# Patient Record
Sex: Female | Born: 1995 | Race: Black or African American | Hispanic: No | Marital: Single | Smoking: Never smoker
Health system: Southern US, Community
[De-identification: ages and names within clinical notes are randomized; demographics above are authoritative.]

## PROBLEM LIST (undated history)

## (undated) DIAGNOSIS — A749 Chlamydial infection, unspecified: Secondary | ICD-10-CM

## (undated) DIAGNOSIS — N83209 Unspecified ovarian cyst, unspecified side: Secondary | ICD-10-CM

## (undated) DIAGNOSIS — N730 Acute parametritis and pelvic cellulitis: Secondary | ICD-10-CM

---
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
## 2015-11-18 ENCOUNTER — Emergency Department (HOSPITAL_COMMUNITY)
Admission: EM | Admit: 2015-11-18 | Discharge: 2015-11-18 | Disposition: A | Discharge status: Home / Self Care | Payer: Self-pay | Attending: Emergency Medicine | Admitting: Emergency Medicine

## 2015-11-18 ENCOUNTER — Encounter (HOSPITAL_COMMUNITY): Payer: Self-pay | Admitting: *Deleted

## 2015-11-18 ENCOUNTER — Emergency Department (HOSPITAL_COMMUNITY): Payer: Self-pay

## 2015-11-18 DIAGNOSIS — R935 Abnormal findings on diagnostic imaging of other abdominal regions, including retroperitoneum: Secondary | ICD-10-CM

## 2015-11-18 DIAGNOSIS — R1032 Left lower quadrant pain: Secondary | ICD-10-CM

## 2015-11-18 DIAGNOSIS — N83202 Unspecified ovarian cyst, left side: Secondary | ICD-10-CM

## 2015-11-18 DIAGNOSIS — N72 Inflammatory disease of cervix uteri: Secondary | ICD-10-CM

## 2015-11-18 DIAGNOSIS — R102 Pelvic and perineal pain: Secondary | ICD-10-CM

## 2015-11-18 LAB — COMPREHENSIVE METABOLIC PANEL
ALBUMIN: 3.3 g/dL — AB (ref 3.5–5.0)
ALT: 12 U/L — ABNORMAL LOW (ref 14–54)
AST: 20 U/L (ref 15–41)
Alkaline Phosphatase: 66 U/L (ref 38–126)
Anion gap: 11 (ref 5–15)
BILIRUBIN TOTAL: 0.6 mg/dL (ref 0.3–1.2)
BUN: 9 mg/dL (ref 6–20)
CO2: 24 mmol/L (ref 22–32)
Calcium: 9.4 mg/dL (ref 8.9–10.3)
Chloride: 105 mmol/L (ref 101–111)
Creatinine, Ser: 0.78 mg/dL (ref 0.44–1.00)
GFR calc Af Amer: >60 mL/min (ref >60)
GFR calc non Af Amer: >60 mL/min (ref >60)
GLUCOSE: 89 mg/dL (ref 65–99)
POTASSIUM: 4.2 mmol/L (ref 3.5–5.1)
Sodium: 140 mmol/L (ref 135–145)
TOTAL PROTEIN: 7.2 g/dL (ref 6.5–8.1)

## 2015-11-18 LAB — URINALYSIS, ROUTINE W REFLEX MICROSCOPIC
BILIRUBIN URINE: NEGATIVE
GLUCOSE, UA: NEGATIVE mg/dL
HGB URINE DIPSTICK: NEGATIVE
KETONES UR: NEGATIVE mg/dL
NITRITE: NEGATIVE
PH: 6.5 (ref 5.0–8.0)
Protein, ur: NEGATIVE mg/dL
Specific Gravity, Urine: 1.026 (ref 1.005–1.030)

## 2015-11-18 LAB — CBC
HEMATOCRIT: 37.6 % (ref 36.0–46.0)
Hemoglobin: 12.3 g/dL (ref 12.0–15.0)
MCH: 28.6 pg (ref 26.0–34.0)
MCHC: 32.7 g/dL (ref 30.0–36.0)
MCV: 87.4 fL (ref 78.0–100.0)
Platelets: 282 10*3/uL (ref 150–400)
RBC: 4.3 MIL/uL (ref 3.87–5.11)
RDW: 12.4 % (ref 11.5–15.5)
WBC: 5.9 10*3/uL (ref 4.0–10.5)

## 2015-11-18 LAB — WET PREP, GENITAL
Clue Cells Wet Prep HPF POC: NONE SEEN
Sperm: NONE SEEN
Trich, Wet Prep: NONE SEEN
Yeast Wet Prep HPF POC: NONE SEEN

## 2015-11-18 LAB — I-STAT BETA HCG BLOOD, ED (MC, WL, AP ONLY): I-stat hCG, quantitative: <5 m[IU]/mL (ref <5)

## 2015-11-18 LAB — LIPASE, BLOOD: Lipase: 23 U/L (ref 11–51)

## 2015-11-18 LAB — URINE MICROSCOPIC-ADD ON: RBC / HPF: NONE SEEN RBC/hpf (ref 0–5)

## 2015-11-18 MED ORDER — DEXTROSE 5 % IV SOLN
1.0000 g | Freq: Once | INTRAVENOUS | Status: AC
  Administered 2015-11-18: 1 g via INTRAVENOUS
  Filled 2015-11-18: qty 10

## 2015-11-18 MED ORDER — IOHEXOL 300 MG/ML  SOLN: 100.0000 mL | Freq: Once | INTRAMUSCULAR | Status: DC | PRN

## 2015-11-18 MED ORDER — CEFTRIAXONE SODIUM 1 G IJ SOLR: 1.0000 g | Freq: Once | INTRAMUSCULAR | Status: DC

## 2015-11-18 MED ORDER — AZITHROMYCIN 250 MG PO TABS
1000.0000 mg | ORAL_TABLET | Freq: Once | ORAL | Status: AC
  Administered 2015-11-18: 1000 mg via ORAL
  Filled 2015-11-18: qty 4

## 2015-11-18 NOTE — Discharge Instructions (Signed)
Cervicitis Cervicitis is a soreness and puffiness (inflammation) of the cervix.  HOME CARE  Do not have sex (intercourse) until your doctor says it is okay.  Do not have sex until your partner is treated or as told by your doctor.  Take your antibiotic medicine as told. Finish it even if you start to feel better. GET HELP IF:   Your symptoms that brought you to the doctor come back.  You have a fever. MAKE SURE YOU:   Understand these instructions.  Will watch your condition.  Will get help right away if you are not doing well or get worse.   This information is not intended to replace advice given to you by your health care provider. Make sure you discuss any questions you have with your health care provider.  Follow up with OBGYN for re-evaluation. Take ibuprofen or tylenol as needed for pain. Your test cultures are still pending. You will be contacted with their results in approximately 72 hours. Return to the ED if you experience severe worsening of your symptoms, fevers, chills, vomiting, vaginal bleeding.

## 2015-11-18 NOTE — ED Provider Notes (Signed)
CSN: [id number]     Arrival date & time 11/18/15  0865 History   First MD Initiated Contact with Patient 11/18/15 1344     Chief Complaint  Patient presents with  . Abdominal Pain     (Consider location/radiation/quality/duration/timing/severity/associated sxs/prior Treatment) HPI   Tammie Lee is a 20 y.o F with no significant pmhx who presents to the ED c/o abdominal pain and increased vaginal discharge. Pt states that after her menstrual cycle ended 1 week ago pt began having RUQ pressure that worsened with coughing, sneezing and movement. Pressure was constant and non-radiating. Pt states that the pain has now moved into her RLQ 3 days ago and is described as crampy. Pt states that it "feels like an ovarian cyst". Pt also has associated green vaginal discharge that has been present for 1 week. Denies dysuria, dyspareunia, fevers, chills, N/V/D, vaginal bleeding, vaginal pruritis. Pt is sexually active with 1 partner. Endorses condom use. No additional contraception use.   History reviewed. No pertinent past medical history. History reviewed. No pertinent past surgical history. History reviewed. No pertinent family history. Social History  Substance Use Topics  . Smoking status: Never Smoker   . Smokeless tobacco: None  . Alcohol Use: No   OB History    No data available     Review of Systems  All other systems reviewed and are negative.     Allergies  Review of patient's allergies indicates no known allergies.  Home Medications   Prior to Admission medications   Not on File   BP 121/78 mmHg  Pulse 98  Temp(Src) 97.9 F (36.6 C) (Oral)  Resp 18  Wt 66.225 kg  SpO2 100%  LMP 11/04/2015 Physical Exam  Constitutional: She is oriented to person, place, and time. She appears well-developed and well-nourished. No distress.  HENT:  Head: Normocephalic and atraumatic.  Mouth/Throat: No oropharyngeal exudate.  Eyes: Conjunctivae and EOM are normal. Pupils are  equal, round, and reactive to light. Right eye exhibits no discharge. Left eye exhibits no discharge. No scleral icterus.  Cardiovascular: Normal rate, regular rhythm, normal heart sounds and intact distal pulses.  Exam reveals no gallop and no friction rub.   No murmur heard. Pulmonary/Chest: Effort normal and breath sounds normal. No respiratory distress. She has no wheezes. She has no rales. She exhibits no tenderness.  Abdominal: Soft. She exhibits no distension and no mass. There is tenderness. There is no rebound and no guarding.    Genitourinary: Cervix exhibits motion tenderness and discharge. Right adnexum displays tenderness. Vaginal discharge found.  Musculoskeletal: Normal range of motion. She exhibits no edema.  Neurological: She is alert and oriented to person, place, and time.  Skin: Skin is warm and dry. No rash noted. She is not diaphoretic. No erythema. No pallor.  Psychiatric: She has a normal mood and affect. Her behavior is normal.  Nursing note and vitals reviewed.   ED Course  Procedures (including critical care time) Labs Review Labs Reviewed  COMPREHENSIVE METABOLIC PANEL - Abnormal; Notable for the following:    Albumin 3.3 (*)    ALT 12 (*)    All other components within normal limits  URINALYSIS, ROUTINE W REFLEX MICROSCOPIC (NOT AT [facility]) - Abnormal; Notable for the following:    Color, Urine AMBER (*)    Leukocytes, UA SMALL (*)    All other components within normal limits  URINE MICROSCOPIC-ADD ON - Abnormal; Notable for the following:    Squamous Epithelial / LPF 6-30 (*)  Bacteria, UA RARE (*)    All other components within normal limits  WET PREP, GENITAL  URINE CULTURE  LIPASE, BLOOD  CBC  I-STAT BETA HCG BLOOD, ED ([facility], [facility], [facility] ONLY)  GC/CHLAMYDIA PROBE AMP ([facility]) NOT AT [facility]    Imaging Review US Transvaginal Non-ob  11/18/2015  CLINICAL DATA:  Left ovarian cystic lesion seen on CT EXAM: TRANSABDOMINAL AND TRANSVAGINAL ULTRASOUND OF  PELVIS TECHNIQUE: Both transabdominal and transvaginal ultrasound examinations of the pelvis were performed. Transabdominal technique was performed for global imaging of the pelvis including uterus, ovaries, adnexal regions, and pelvic cul-de-sac. It was necessary to proceed with endovaginal exam following the transabdominal exam to visualize the endometrium and ovaries. COMPARISON:  CT earlier today FINDINGS: Uterus Measurements: 7.0 x 3.4 x 4.4 cm. No fibroids or other mass visualized. Endometrium Thickness: 6 mm in thickness.  No focal abnormality visualized. Right ovary Measurements: 3.1 x 2.5 x 2.6 cm. Normal appearance/no adnexal mass. Left ovary Measurements: 4.4 x 3.4 x 4.1 cm. 3.7 cm complex cystic lesion with internal echoes/debris, most likely hemorrhagic cyst or endometrioma. Other findings Trace free fluid in the right adnexa. IMPRESSION: 3.7 cm complex cystic lesion within the left ovary with internal echoes and debris. This could reflect a hemorrhagic cyst or endometrioma. Electronically Signed   By: Charlett Nose M.D.   On: 11/18/2015 18:32   US Pelvis Complete  11/18/2015  CLINICAL DATA:  Left ovarian cystic lesion seen on CT EXAM: TRANSABDOMINAL AND TRANSVAGINAL ULTRASOUND OF PELVIS TECHNIQUE: Both transabdominal and transvaginal ultrasound examinations of the pelvis were performed. Transabdominal technique was performed for global imaging of the pelvis including uterus, ovaries, adnexal regions, and pelvic cul-de-sac. It was necessary to proceed with endovaginal exam following the transabdominal exam to visualize the endometrium and ovaries. COMPARISON:  CT earlier today FINDINGS: Uterus Measurements: 7.0 x 3.4 x 4.4 cm. No fibroids or other mass visualized. Endometrium Thickness: 6 mm in thickness.  No focal abnormality visualized. Right ovary Measurements: 3.1 x 2.5 x 2.6 cm. Normal appearance/no adnexal mass. Left ovary Measurements: 4.4 x 3.4 x 4.1 cm. 3.7 cm complex cystic lesion with  internal echoes/debris, most likely hemorrhagic cyst or endometrioma. Other findings Trace free fluid in the right adnexa. IMPRESSION: 3.7 cm complex cystic lesion within the left ovary with internal echoes and debris. This could reflect a hemorrhagic cyst or endometrioma. Electronically Signed   By: Charlett Nose M.D.   On: 11/18/2015 18:32   Ct Abdomen Pelvis W Contrast  11/18/2015  CLINICAL DATA:  RIGHT upper quadrant pain for 2 weeks. Pain radiates to the RIGHT lower quadrant. EXAM: CT ABDOMEN AND PELVIS WITH CONTRAST TECHNIQUE: Multidetector CT imaging of the abdomen and pelvis was performed using the standard protocol following bolus administration of intravenous contrast. CONTRAST:  100 cc Omnipaque COMPARISON:  None. FINDINGS: Lower chest: Lung bases are clear. Hepatobiliary: No focal hepatic lesion. No biliary duct dilatation. Gallbladder is normal. Common bile duct is normal. Pancreas: Pancreas is normal. No ductal dilatation. No pancreatic inflammation. Spleen: Normal spleen Adrenals/urinary tract: Adrenal glands and kidneys are normal. The ureters and bladder normal. Stomach/Bowel: Stomach, duodenum, small bowel are normal. There is mild submucosal thickening in the cecum (Image 64, series 3). The appendix is partially imaged and difficult ot follow. Appendix is best seen on sagittal image 65, series 7 measuring 6 mm in diameter which is within normal limits. Tip of the appendix not well localized The ascending, transverse and descending colon normal. The rectosigmoid colon is  normal. Vascular/Lymphatic: Abdominal aorta is normal caliber. There is no retroperitoneal or periportal lymphadenopathy. No pelvic lymphadenopathy. Reproductive: Uterus is normal. Cystic lesion associated with the LEFT adnexa measures 3.2 x 3.1 by 4.5 cm. There is some mural thickening of this cystic lesion and focal mural nodularity (image 63, series 3). A small cystic lesion associated the RIGHT ovary measures 2.2 cm. Small  amount free fluid in the pelvis anterior to the uterus and bladder (image 70, series 3 is also seen on sagittal image 82, series 7. Other: Smaller free fluid anterior to the uterus and superior to the bladder. Musculoskeletal: No aggressive osseous lesion. IMPRESSION: 1. Cystic lesion associated with the LEFT ovary measures up to 4.5 cm has mild mural thickening. Differential would include a hemorrhagic cyst, complex functional ovarian cyst, tubo-ovarian abscess or other forms of pelvic inflammatory disease. 2. Small amount free fluid in the pelvis positioned anteriorly between the uterus and bladder. 3. While the appendix is not well identified overall findings do not favor appendicitis but this cannot be completely excluded as there is some mild thickening of the cecum and appendix not identified in its entirety. 4. Consider a pelvic ultrasound to evaluate the LEFT adnexa. Findings conveyed toBeaton, MDon 11/18/2015  at17:03. Electronically Signed   By: Genevive Bi M.D.   On: 11/18/2015 17:03   I have personally reviewed and evaluated these images and lab results as part of my medical decision-making.   EKG Interpretation None      MDM   Final diagnoses:  Cervicitis  Cyst of left ovary    Otherwise healthy 20 year old female presents with right lower quadrant abdominal pain and vaginal discharge onset 1 week ago. Patient appears well in ED, in no apparent distress. All vital signs are stable. Afebrile. Copious vaginal discharge present in vaginal vault. Patient is sexually active with one partner. Many WBCs present on wet prep. We'll treat prophylactically with Rocephin and azithromycin. Patient is also significantly tender in the right lower quadrant. On bimanual exam patient also had right adnexal tenderness and CMT. Difficult to differentiate between pain in the RLQ versus right adnexa. Will CT abdomen to rule out appendicitis versus TOA. No leukocytosis. CT reveals 4.5 cystic lesion on left  ovary. DDX includes hemorrhagic cyst, TOA or other form of PID. Radiology recommends pelvic ultrasound to further evaluate. Pelvic ultrasound reveals 3.7 cm complex cystic lesion within left ovary which likely reflects either eye hemorrhagic cyst or endometrioma. Will DC home with OB/GYN follow-up. Referral given. Patient informed that her GC and chlamydia cultures are pending and she'll be contacted with the results of approximately 72 hours. Encourage 6 sex practices. Return precautions outlined in patient discharge instructions. Patient is hemodynamically stable and ready for discharge.    Lester Kinsman [place], PA-C 11/18/15 1857  Melene Plan, DO 11/19/15 [phone]

## 2015-11-18 NOTE — ED Notes (Signed)
Pt reports RUQ and RLQ x 2 weeks. Her last menstrual was abnormal and heavy, also reports green vaginal discharge.

## 2015-11-18 NOTE — ED Notes (Signed)
Pt stable, ambulatory, states understanding of discharge instructions 

## 2015-11-19 LAB — URINE CULTURE

## 2015-11-21 LAB — GC/CHLAMYDIA PROBE AMP (~~LOC~~) NOT AT ARMC
Chlamydia: POSITIVE — AB
Neisseria Gonorrhea: NEGATIVE

## 2015-11-22 ENCOUNTER — Telehealth (HOSPITAL_BASED_OUTPATIENT_CLINIC_OR_DEPARTMENT_OTHER): Payer: Self-pay | Admitting: Emergency Medicine

## 2016-09-30 ENCOUNTER — Encounter (HOSPITAL_COMMUNITY): Payer: Self-pay | Admitting: *Deleted

## 2016-09-30 ENCOUNTER — Encounter (HOSPITAL_COMMUNITY): Payer: Self-pay

## 2016-09-30 ENCOUNTER — Ambulatory Visit (HOSPITAL_COMMUNITY)
Admission: EM | Admit: 2016-09-30 | Discharge: 2016-09-30 | Disposition: A | Discharge status: Nursing Home (Includes ICF) | Payer: Medicaid Other | Attending: Family Medicine | Admitting: Family Medicine

## 2016-09-30 ENCOUNTER — Emergency Department (HOSPITAL_COMMUNITY): Payer: Medicaid Other

## 2016-09-30 ENCOUNTER — Emergency Department (HOSPITAL_COMMUNITY)
Admission: EM | Admit: 2016-09-30 | Discharge: 2016-10-01 | Disposition: A | Discharge status: Home / Self Care | Payer: Medicaid Other | Attending: Emergency Medicine | Admitting: Emergency Medicine

## 2016-09-30 DIAGNOSIS — R509 Fever, unspecified: Secondary | ICD-10-CM

## 2016-09-30 DIAGNOSIS — R1084 Generalized abdominal pain: Secondary | ICD-10-CM

## 2016-09-30 DIAGNOSIS — R197 Diarrhea, unspecified: Secondary | ICD-10-CM

## 2016-09-30 DIAGNOSIS — Z79899 Other long term (current) drug therapy: Secondary | ICD-10-CM | POA: Insufficient documentation

## 2016-09-30 DIAGNOSIS — R102 Pelvic and perineal pain: Secondary | ICD-10-CM | POA: Insufficient documentation

## 2016-09-30 DIAGNOSIS — N898 Other specified noninflammatory disorders of vagina: Secondary | ICD-10-CM

## 2016-09-30 DIAGNOSIS — Z711 Person with feared health complaint in whom no diagnosis is made: Secondary | ICD-10-CM

## 2016-09-30 DIAGNOSIS — N946 Dysmenorrhea, unspecified: Secondary | ICD-10-CM | POA: Insufficient documentation

## 2016-09-30 DIAGNOSIS — R112 Nausea with vomiting, unspecified: Secondary | ICD-10-CM

## 2016-09-30 HISTORY — DX: Chlamydial infection, unspecified: A74.9

## 2016-09-30 HISTORY — DX: Acute parametritis and pelvic cellulitis: N73.0

## 2016-09-30 LAB — COMPREHENSIVE METABOLIC PANEL
ALBUMIN: 4.1 g/dL (ref 3.5–5.0)
ALT: 12 U/L — ABNORMAL LOW (ref 14–54)
ANION GAP: 13 (ref 5–15)
AST: 29 U/L (ref 15–41)
Alkaline Phosphatase: 70 U/L (ref 38–126)
BUN: 12 mg/dL (ref 6–20)
CO2: 22 mmol/L (ref 22–32)
Calcium: 9 mg/dL (ref 8.9–10.3)
Chloride: 103 mmol/L (ref 101–111)
Creatinine, Ser: 0.89 mg/dL (ref 0.44–1.00)
GFR calc Af Amer: >60 mL/min (ref >60)
GFR calc non Af Amer: >60 mL/min (ref >60)
GLUCOSE: 109 mg/dL — AB (ref 65–99)
POTASSIUM: 3.9 mmol/L (ref 3.5–5.1)
SODIUM: 138 mmol/L (ref 135–145)
Total Bilirubin: 0.9 mg/dL (ref 0.3–1.2)
Total Protein: 7.5 g/dL (ref 6.5–8.1)

## 2016-09-30 LAB — POCT URINALYSIS DIP (DEVICE)
BILIRUBIN URINE: NEGATIVE
Glucose, UA: NEGATIVE mg/dL
Ketones, ur: NEGATIVE mg/dL
LEUKOCYTES UA: NEGATIVE
NITRITE: NEGATIVE
Protein, ur: NEGATIVE mg/dL
Specific Gravity, Urine: 1.025 (ref 1.005–1.030)
Urobilinogen, UA: 0.2 mg/dL (ref 0.0–1.0)
pH: 6 (ref 5.0–8.0)

## 2016-09-30 LAB — WET PREP, GENITAL
Sperm: NONE SEEN
TRICH WET PREP: NONE SEEN
YEAST WET PREP: NONE SEEN

## 2016-09-30 LAB — CBC WITH DIFFERENTIAL/PLATELET
BASOS ABS: 0 10*3/uL (ref 0.0–0.1)
Basophils Relative: 0 %
Eosinophils Absolute: 0 10*3/uL (ref 0.0–0.7)
Eosinophils Relative: 0 %
HEMATOCRIT: 44.6 % (ref 36.0–46.0)
Hemoglobin: 15 g/dL (ref 12.0–15.0)
LYMPHS PCT: 5 %
Lymphs Abs: 0.3 10*3/uL — ABNORMAL LOW (ref 0.7–4.0)
MCH: 29.4 pg (ref 26.0–34.0)
MCHC: 33.6 g/dL (ref 30.0–36.0)
MCV: 87.3 fL (ref 78.0–100.0)
MONO ABS: 0.3 10*3/uL (ref 0.1–1.0)
MONOS PCT: 4 %
NEUTROS ABS: 6.4 10*3/uL (ref 1.7–7.7)
Neutrophils Relative %: 91 %
Platelets: 187 10*3/uL (ref 150–400)
RBC: 5.11 MIL/uL (ref 3.87–5.11)
RDW: 13 % (ref 11.5–15.5)
WBC: 7 10*3/uL (ref 4.0–10.5)

## 2016-09-30 LAB — URINALYSIS, ROUTINE W REFLEX MICROSCOPIC
Bacteria, UA: NONE SEEN
Bilirubin Urine: NEGATIVE
GLUCOSE, UA: NEGATIVE mg/dL
Ketones, ur: 5 mg/dL — AB
Leukocytes, UA: NEGATIVE
Nitrite: NEGATIVE
PH: 5 (ref 5.0–8.0)
Protein, ur: 30 mg/dL — AB
SPECIFIC GRAVITY, URINE: 1.028 (ref 1.005–1.030)

## 2016-09-30 LAB — I-STAT CG4 LACTIC ACID, ED: Lactic Acid, Venous: 1.88 mmol/L (ref 0.5–1.9)

## 2016-09-30 LAB — POC URINE PREG, ED: Preg Test, Ur: NEGATIVE

## 2016-09-30 LAB — POCT PREGNANCY, URINE: PREG TEST UR: NEGATIVE

## 2016-09-30 MED ORDER — ACETAMINOPHEN 325 MG PO TABS
ORAL_TABLET | ORAL | Status: AC
  Administered 2016-09-30: 650 mg via ORAL
  Filled 2016-09-30: qty 2

## 2016-09-30 MED ORDER — KETOROLAC TROMETHAMINE 30 MG/ML IJ SOLN
30.0000 mg | Freq: Once | INTRAMUSCULAR | Status: AC
  Administered 2016-09-30: 30 mg via INTRAVENOUS
  Filled 2016-09-30: qty 1

## 2016-09-30 MED ORDER — ONDANSETRON HCL 4 MG/2ML IJ SOLN
4.0000 mg | Freq: Once | INTRAMUSCULAR | Status: AC
  Administered 2016-09-30: 4 mg via INTRAVENOUS
  Filled 2016-09-30: qty 2

## 2016-09-30 MED ORDER — SODIUM CHLORIDE 0.9 % IV BOLUS (SEPSIS): 1000.0000 mL | Freq: Once | INTRAVENOUS | Status: DC

## 2016-09-30 MED ORDER — DEXTROSE 5 % IV SOLN
250.0000 mg | Freq: Once | INTRAVENOUS | Status: AC
  Administered 2016-09-30: 250 mg via INTRAVENOUS
  Filled 2016-09-30: qty 250

## 2016-09-30 MED ORDER — SODIUM CHLORIDE 0.9 % IV BOLUS (SEPSIS)
2000.0000 mL | Freq: Once | INTRAVENOUS | Status: AC
  Administered 2016-09-30: 2000 mL via INTRAVENOUS

## 2016-09-30 MED ORDER — ACETAMINOPHEN 325 MG PO TABS
650.0000 mg | ORAL_TABLET | Freq: Once | ORAL | Status: AC
  Administered 2016-09-30: 650 mg via ORAL

## 2016-09-30 NOTE — ED Provider Notes (Signed)
CSN: [id number]     Arrival date & time 09/30/16  1554 History   First MD Initiated Contact with Patient 09/30/16 1638     Chief Complaint  Patient presents with  . Chills  . Abdominal Pain   (Consider location/radiation/quality/duration/timing/severity/associated sxs/prior Treatment) HPI Tammie Lee is a 21 y.o. female presenting to UC with c/o gradually worsening severe abdominal pain with associated n/v/d and vaginal discharge. Pain has worsened over the last few months. She reports hx of PID and is concerned for STI but no specific known exposure. Pain is 7/10, cramping and sharp.  No hx of abdominal surgeries.  Past Medical History:  Diagnosis Date  . Chlamydia   . PID (acute pelvic inflammatory disease)    History reviewed. No pertinent surgical history. No family history on file. Social History  Substance Use Topics  . Smoking status: Never Smoker  . Smokeless tobacco: Never Used  . Alcohol use Yes     Comment: occasional   OB History    No data available     Review of Systems  Constitutional: Negative for chills and fever.  HENT: Negative for congestion, ear pain, sore throat, trouble swallowing and voice change.   Respiratory: Negative for cough and shortness of breath.   Cardiovascular: Negative for chest pain and palpitations.  Gastrointestinal: Positive for abdominal pain, diarrhea, nausea and vomiting.  Genitourinary: Positive for vaginal discharge. Negative for dysuria, frequency and hematuria.  Musculoskeletal: Negative for arthralgias, back pain and myalgias.  Skin: Negative for rash.    Allergies  Patient has no known allergies.  Home Medications   Prior to Admission medications   Not on File   Meds Ordered and Administered this Visit  Medications - No data to display  BP 119/61   Pulse (!) 124   Temp 98.6 F (37 C) (Oral)   Resp 20   LMP 09/28/2016 (Exact Date)   SpO2 100%  No data found.   Physical Exam  Constitutional: She  appears well-developed and well-nourished. No distress.  HENT:  Head: Normocephalic and atraumatic.  Nose: Nose normal.  Mouth/Throat: Uvula is midline, oropharynx is clear and moist and mucous membranes are normal.  Eyes: Conjunctivae are normal. No scleral icterus.  Neck: Normal range of motion.  Cardiovascular: Regular rhythm and normal heart sounds.  Tachycardia present.   Pulmonary/Chest: Effort normal and breath sounds normal. No respiratory distress. She has no wheezes. She has no rales.  Abdominal: Soft. She exhibits no distension and no mass. There is tenderness. There is no rebound, no guarding and no CVA tenderness.  Obese abdomen, diffuse tenderness  Musculoskeletal: Normal range of motion.  Neurological: She is alert.  Skin: Skin is warm and dry. She is not diaphoretic.  Nursing note and vitals reviewed.   Urgent Care Course   Clinical Course     Procedures (including critical care time)  Labs Review Labs Reviewed  POCT URINALYSIS DIP (DEVICE) - Abnormal; Notable for the following:       Result Value   Hgb urine dipstick LARGE (*)    All other components within normal limits  POCT PREGNANCY, URINE    Imaging Review No results found.    MDM   1. Generalized abdominal pain   2. Nausea vomiting and diarrhea   3. Vaginal discharge   4. Concern about STD in female without diagnosis    Pt c/o severe diffuse abdominal pain for several months. Hx of PID. Pt currently c/o n/v/d and vaginal discharge.  Pt requesting to be sent to ED.  Pt transferred to ED via shuttle for further evaluation and treatment of severe abdominal pain.   Junius Finnerrin O'Malley, PA-C 09/30/16 1658

## 2016-09-30 NOTE — ED Provider Notes (Signed)
[facility]    CSN: [id number] Arrival date & time: 09/30/16  1554     History   Chief Complaint Chief Complaint  Patient presents with  . Chills  . Abdominal Pain    HPI Tammie Lee is a 21 y.o. female.   HPI  Past Medical History:  Diagnosis Date  . Chlamydia   . PID (acute pelvic inflammatory disease)     There are no active problems to display for this patient.   History reviewed. No pertinent surgical history.  OB History    No data available       Home Medications    Prior to Admission medications   Not on File    Family History No family history on file.  Social History Social History  Substance Use Topics  . Smoking status: Never Smoker  . Smokeless tobacco: Never Used  . Alcohol use Yes     Comment: occasional     Allergies   Patient has no known allergies.   Review of Systems Review of Systems   Physical Exam Triage Vital Signs ED Triage Vitals  Enc Vitals Group     BP 09/30/16 1611 119/61     Pulse Rate 09/30/16 1611 (!) 124     Resp 09/30/16 1611 20     Temp 09/30/16 1611 98.6 F (37 C)     Temp Source 09/30/16 1611 Oral     SpO2 09/30/16 1611 100 %     Weight --      Height --      Head Circumference --      Peak Flow --      Pain Score 09/30/16 1613 7     Pain Loc --      Pain Edu? --      Excl. in GC? --    No data found.   Updated Vital Signs BP 119/61   Pulse (!) 124   Temp 98.6 F (37 C) (Oral)   Resp 20   LMP 09/28/2016 (Exact Date)   SpO2 100%   Visual Acuity Right Eye Distance:   Left Eye Distance:   Bilateral Distance:    Right Eye Near:   Left Eye Near:    Bilateral Near:     Physical Exam   UC Treatments / Results  Labs (all labs ordered are listed, but only abnormal results are displayed) Labs Reviewed  POCT URINALYSIS DIP (DEVICE) - Abnormal; Notable for the following:       Result Value   Hgb urine dipstick LARGE (*)    All other components within normal  limits  POCT PREGNANCY, URINE    EKG  EKG Interpretation None       Radiology No results found.  Procedures Procedures (including critical care time)  Medications Ordered in UC Medications - No data to display   Initial Impression / Assessment and Plan / UC Course  I have reviewed the triage vital signs and the nursing notes.  Pertinent labs & imaging results that were available during my care of the patient were reviewed by me and considered in my medical decision making (see chart for details).  Clinical Course       Final Clinical Impressions(s) / UC Diagnoses   Final diagnoses:  Generalized abdominal pain  Nausea vomiting and diarrhea  Vaginal discharge  Concern about STD in female without diagnosis    New Prescriptions New Prescriptions   No medications on file     Quita Skye  Artis FlockKindl, MD 09/30/16 2038

## 2016-09-30 NOTE — ED Provider Notes (Signed)
[facility]-EMERGENCY DEPT Provider Note   CSN: [id number] Arrival date & time: 09/30/16  1704    History   Chief Complaint Chief Complaint  Patient presents with  . Abdominal Pain  . Vaginal Bleeding    HPI Tammie Lee is a 21 y.o. female.  21 year old female with history of pelvic inflammatory disease presents to the emergency department from urgent care for further evaluation of abdominal pain. Patient states that she developed cramping, sharp lower abdominal discomfort 2 days ago. This has been worsening over the past 24 hours. Symptoms associated with vaginal bleeding which has become heavier today. Patient has also noticed a thick, white discharge. She has had some nausea and vomiting as well as sporadic episodes of looser stool. She denies any melena, hematochezia, hematemesis, dysuria, hematuria. She complains of intermittent chills, she has not taken her temperature at home. Patient noted to have a fever of 103.34F in triage. No history of abdominal surgeries. She is concerned that her symptoms today are secondary to PID.   The history is provided by the patient. No language interpreter was used.    Past Medical History:  Diagnosis Date  . Chlamydia   . PID (acute pelvic inflammatory disease)     There are no active problems to display for this patient.   History reviewed. No pertinent surgical history.  OB History    No data available       Home Medications    Prior to Admission medications   Medication Sig Start Date End Date Taking? Authorizing Provider  doxycycline (VIBRAMYCIN) 100 MG capsule Take 1 capsule (100 mg total) by mouth 2 (two) times daily. Take as prescribed for 14 days. 10/01/16   Antony Madura, PA-C  naproxen (NAPROSYN) 500 MG tablet Take 1 tablet (500 mg total) by mouth 2 (two) times daily. 10/01/16   Antony Madura, PA-C  traMADol (ULTRAM) 50 MG tablet Take 1 tablet (50 mg total) by mouth every 6 (six) hours as needed for severe pain. 10/01/16   Antony Madura, PA-C    Family History No family history on file.  Social History Social History  Substance Use Topics  . Smoking status: Never Smoker  . Smokeless tobacco: Never Used  . Alcohol use Yes     Comment: occasional     Allergies   Patient has no known allergies.   Review of Systems Review of Systems Ten systems reviewed and are negative for acute change, except as noted in the HPI.    Physical Exam Updated Vital Signs BP 103/59   Pulse 95   Temp 98.9 F (37.2 C) (Oral)   Resp 16   LMP 09/28/2016 (Exact Date)   SpO2 100%   Physical Exam  Constitutional: She is oriented to person, place, and time. She appears well-developed and well-nourished. No distress.  Nontoxic and in NAD  HENT:  Head: Normocephalic and atraumatic.  Mouth/Throat: Oropharynx is clear and moist.  Eyes: Conjunctivae and EOM are normal. No scleral icterus.  Neck: Normal range of motion.  Cardiovascular: Regular rhythm and intact distal pulses.   Mild tachycardia  Pulmonary/Chest: Effort normal. No respiratory distress. She has no wheezes. She has no rales.  Respirations even and unlabored. Lungs CTAB.  Abdominal: Soft. She exhibits no distension. There is tenderness. There is no guarding.  Normoactive bowel sounds. TTP in bilateral lower quadrants. No masses or peritoneal signs.  Genitourinary: There is no rash, tenderness or lesion on the right labia. There is no rash, tenderness or lesion on  the left labia. Uterus is tender (mild). Cervix exhibits no motion tenderness and no friability. Right adnexum displays tenderness (minimal). Right adnexum displays no mass. Left adnexum displays tenderness (minimal). Left adnexum displays no mass. There is bleeding (small; no clots) in the vagina. No erythema in the vagina. No foreign body in the vagina. No signs of injury around the vagina. Vaginal discharge (scant, clear discharge) found.  Musculoskeletal: Normal range of motion.  Neurological: She is  alert and oriented to person, place, and time. She exhibits normal muscle tone. Coordination normal.  Ambulatory with steady gait.  Skin: Skin is warm and dry. No rash noted. She is not diaphoretic. No erythema. No pallor.  Psychiatric: She has a normal mood and affect. Her behavior is normal.  Nursing note and vitals reviewed.    ED Treatments / Results  Labs (all labs ordered are listed, but only abnormal results are displayed) Labs Reviewed  WET PREP, GENITAL - Abnormal; Notable for the following:       Result Value   Clue Cells Wet Prep HPF POC PRESENT (*)    WBC, Wet Prep HPF POC FEW (*)    All other components within normal limits  COMPREHENSIVE METABOLIC PANEL - Abnormal; Notable for the following:    Glucose, Bld 109 (*)    ALT 12 (*)    All other components within normal limits  CBC WITH DIFFERENTIAL/PLATELET - Abnormal; Notable for the following:    Lymphs Abs 0.3 (*)    All other components within normal limits  URINALYSIS, ROUTINE W REFLEX MICROSCOPIC - Abnormal; Notable for the following:    APPearance HAZY (*)    Hgb urine dipstick LARGE (*)    Ketones, ur 5 (*)    Protein, ur 30 (*)    Squamous Epithelial / LPF 0-5 (*)    All other components within normal limits  I-STAT CG4 LACTIC ACID, ED  POC URINE PREG, ED  GC/CHLAMYDIA PROBE AMP ([place]) NOT AT [facility]    EKG  EKG Interpretation None       Radiology US Transvaginal Non-ob  Result Date: 09/30/2016 CLINICAL DATA:  Acute onset of vaginal bleeding the pelvic pain. Initial encounter. EXAM: TRANSABDOMINAL AND TRANSVAGINAL ULTRASOUND OF PELVIS DOPPLER ULTRASOUND OF OVARIES TECHNIQUE: Both transabdominal and transvaginal ultrasound examinations of the pelvis were performed. Transabdominal technique was performed for global imaging of the pelvis including uterus, ovaries, adnexal regions, and pelvic cul-de-sac. It was necessary to proceed with endovaginal exam following the transabdominal exam to visualize  the endometrium in greater detail. Color and duplex Doppler ultrasound was utilized to evaluate blood flow to the ovaries. COMPARISON:  Pelvic ultrasound and CT of the abdomen and pelvis performed 11/18/2015 FINDINGS: Uterus Measurements: 7.1 x 2.9 x 4.8 cm. No fibroids or other mass visualized. Endometrium Thickness: 0.5 cm.  No focal abnormality visualized. Right ovary Measurements: 2.6 x 1.7 x 1.6 cm. Normal appearance/no adnexal mass. Left ovary Measurements: 2.2 x 1.5 x 1.6 cm. Normal appearance/no adnexal mass. Pulsed Doppler evaluation of both ovaries demonstrates normal low-resistance arterial and venous waveforms. Other findings Trace free fluid is seen within the pelvic cul-de-sac. IMPRESSION: Unremarkable pelvic ultrasound.  No evidence for ovarian torsion. Electronically Signed   By: Roanna Raider M.D.   On: 09/30/2016 21:50   US Pelvis Complete  Result Date: 09/30/2016 CLINICAL DATA:  Acute onset of vaginal bleeding the pelvic pain. Initial encounter. EXAM: TRANSABDOMINAL AND TRANSVAGINAL ULTRASOUND OF PELVIS DOPPLER ULTRASOUND OF OVARIES TECHNIQUE: Both transabdominal and transvaginal  ultrasound examinations of the pelvis were performed. Transabdominal technique was performed for global imaging of the pelvis including uterus, ovaries, adnexal regions, and pelvic cul-de-sac. It was necessary to proceed with endovaginal exam following the transabdominal exam to visualize the endometrium in greater detail. Color and duplex Doppler ultrasound was utilized to evaluate blood flow to the ovaries. COMPARISON:  Pelvic ultrasound and CT of the abdomen and pelvis performed 11/18/2015 FINDINGS: Uterus Measurements: 7.1 x 2.9 x 4.8 cm. No fibroids or other mass visualized. Endometrium Thickness: 0.5 cm.  No focal abnormality visualized. Right ovary Measurements: 2.6 x 1.7 x 1.6 cm. Normal appearance/no adnexal mass. Left ovary Measurements: 2.2 x 1.5 x 1.6 cm. Normal appearance/no adnexal mass. Pulsed Doppler  evaluation of both ovaries demonstrates normal low-resistance arterial and venous waveforms. Other findings Trace free fluid is seen within the pelvic cul-de-sac. IMPRESSION: Unremarkable pelvic ultrasound.  No evidence for ovarian torsion. Electronically Signed   By: Roanna RaiderJeffery  Chang M.D.   On: 09/30/2016 21:50   [place] Art/ven Flow Abd Pelv Doppler  Result Date: 09/30/2016 CLINICAL DATA:  Acute onset of vaginal bleeding the pelvic pain. Initial encounter. EXAM: TRANSABDOMINAL AND TRANSVAGINAL ULTRASOUND OF PELVIS DOPPLER ULTRASOUND OF OVARIES TECHNIQUE: Both transabdominal and transvaginal ultrasound examinations of the pelvis were performed. Transabdominal technique was performed for global imaging of the pelvis including uterus, ovaries, adnexal regions, and pelvic cul-de-sac. It was necessary to proceed with endovaginal exam following the transabdominal exam to visualize the endometrium in greater detail. Color and duplex Doppler ultrasound was utilized to evaluate blood flow to the ovaries. COMPARISON:  Pelvic ultrasound and CT of the abdomen and pelvis performed 11/18/2015 FINDINGS: Uterus Measurements: 7.1 x 2.9 x 4.8 cm. No fibroids or other mass visualized. Endometrium Thickness: 0.5 cm.  No focal abnormality visualized. Right ovary Measurements: 2.6 x 1.7 x 1.6 cm. Normal appearance/no adnexal mass. Left ovary Measurements: 2.2 x 1.5 x 1.6 cm. Normal appearance/no adnexal mass. Pulsed Doppler evaluation of both ovaries demonstrates normal low-resistance arterial and venous waveforms. Other findings Trace free fluid is seen within the pelvic cul-de-sac. IMPRESSION: Unremarkable pelvic ultrasound.  No evidence for ovarian torsion. Electronically Signed   By: Roanna RaiderJeffery  Chang M.D.   On: 09/30/2016 21:50    Procedures Procedures (including critical care time)  Medications Ordered in ED Medications  acetaminophen (TYLENOL) tablet 650 mg (not administered)  acetaminophen (TYLENOL) tablet 650 mg (650 mg Oral  Given 09/30/16 1740)  ondansetron (ZOFRAN) injection 4 mg (4 mg Intravenous Given 09/30/16 2341)  sodium chloride 0.9 % bolus 2,000 mL (2,000 mLs Intravenous New Bag/Given 09/30/16 2348)  cefTRIAXone (ROCEPHIN) 250 mg in dextrose 5 % 50 mL IVPB (250 mg Intravenous New Bag/Given 09/30/16 2346)  ketorolac (TORADOL) 30 MG/ML injection 30 mg (30 mg Intravenous Given 09/30/16 2341)     Initial Impression / Assessment and Plan / ED Course  I have reviewed the triage vital signs and the nursing notes.  Pertinent labs & imaging results that were available during my care of the patient were reviewed by me and considered in my medical decision making (see chart for details).  Clinical Course     Patient to be discharged with instructions to follow up with OBGYN. Discussed importance of using protection when sexually active. Pt understands that they have GC/Chlamydia cultures pending and that they will need to inform all sexual partners if results return positive. Pt to be treated for PID with doxycycline on an outpatient basis. Rocephin given in the ED. Suspect that fever may  be due to viral illness as patient has no evidence of TOA on ultrasound and no leukocytosis. Normal lactate. Patient nontoxic appearing in no acute distress. She has been told to return to the emergency department for new or worsening symptoms. Return precautions given at discharge. Patient agreeable to plan with no unaddressed concerns; discharged in stable condition.   Final Clinical Impressions(s) / ED Diagnoses   Final diagnoses:  Pelvic pain  Dysmenorrhea  Febrile illness    New Prescriptions New Prescriptions   DOXYCYCLINE (VIBRAMYCIN) 100 MG CAPSULE    Take 1 capsule (100 mg total) by mouth 2 (two) times daily. Take as prescribed for 14 days.   NAPROXEN (NAPROSYN) 500 MG TABLET    Take 1 tablet (500 mg total) by mouth 2 (two) times daily.   TRAMADOL (ULTRAM) 50 MG TABLET    Take 1 tablet (50 mg total) by mouth every 6 (six)  hours as needed for severe pain.     Antony Madura, PA-C 10/08/16 2052    Linwood Dibbles, MD 10/08/16 2155

## 2016-09-30 NOTE — ED Triage Notes (Signed)
States prior to heavy vaginal bleeding onset 2 days ago, had been having a very "light, stinky" vaginal discharge x "months" without eval.

## 2016-09-30 NOTE — ED Triage Notes (Signed)
Patient complains of 2 days of abdominal discomfort with heavy vaginal bleeding. States that the flow is much heavier than normal, complains of chills with same. Alert and oriented, NAD

## 2016-09-30 NOTE — ED Triage Notes (Signed)
C/O generalized constant abd pain with chills and heavy vaginal bleeding x 2 days.  Unsure how high fevers are.  Unable to keep down any PO fluids.  Also c/o diarrhea.

## 2016-09-30 NOTE — ED Notes (Signed)
Attempted IV x3, unsuccessful.

## 2016-09-30 NOTE — ED Notes (Signed)
Shanda BumpsJessica RN attempted IV x 1.

## 2016-10-01 LAB — GC/CHLAMYDIA PROBE AMP (~~LOC~~) NOT AT ARMC
Chlamydia: NEGATIVE
Neisseria Gonorrhea: NEGATIVE

## 2016-10-01 MED ORDER — ACETAMINOPHEN 325 MG PO TABS
650.0000 mg | ORAL_TABLET | Freq: Once | ORAL | Status: AC
  Administered 2016-10-01: 650 mg via ORAL
  Filled 2016-10-01: qty 2

## 2016-10-01 MED ORDER — DOXYCYCLINE HYCLATE 100 MG PO CAPS: 100.0000 mg | ORAL_CAPSULE | Freq: Two times a day (BID) | ORAL | 0 refills | Status: DC

## 2016-10-01 MED ORDER — TRAMADOL HCL 50 MG PO TABS: 50.0000 mg | ORAL_TABLET | Freq: Four times a day (QID) | ORAL | 0 refills | Status: DC | PRN

## 2016-10-01 MED ORDER — NAPROXEN 500 MG PO TABS: 500.0000 mg | ORAL_TABLET | Freq: Two times a day (BID) | ORAL | 0 refills | Status: DC

## 2016-10-01 NOTE — Discharge Instructions (Signed)
Follow-up on the results of your STD tests in 48 hours with the [facility]. If you test positive for STDs, notify all sexual partners of their need to be tested and treated as well. Take doxycycline as prescribed for coverage of pelvic inflammatory disease. It is also possible that your fever with body aches, vomiting, and diarrhea may be due to a flulike illness. We advised you take Tylenol every 6 hours for fever area take naproxen as prescribed for pain. You may take tramadol as needed for severe pain. Follow-up with your OB/GYN regarding your visit today. You may return for new or concerning symptoms.

## 2017-03-07 IMAGING — CT CT ABD-PELV W/ CM
2 of 4 series · 15 of 46 positions shown, 17 images · IV contrast (Omni 300)
Comparison: None.

CLINICAL DATA: RIGHT upper quadrant pain for 2 weeks. Pain radiates
to the RIGHT lower quadrant.

EXAM:
CT ABDOMEN AND PELVIS WITH CONTRAST
TECHNIQUE: Multidetector CT imaging of the abdomen and pelvis was performed
using the standard protocol following bolus administration of
intravenous contrast.
CONTRAST:  100 cc Omnipaque

[Series 3: a/p w/ 5mm · axial · 0.71mm/px · z∈[+876,+1266]mm · 12 of 86 slices shown, 14 images]
[im 4/86  soft-tissue]
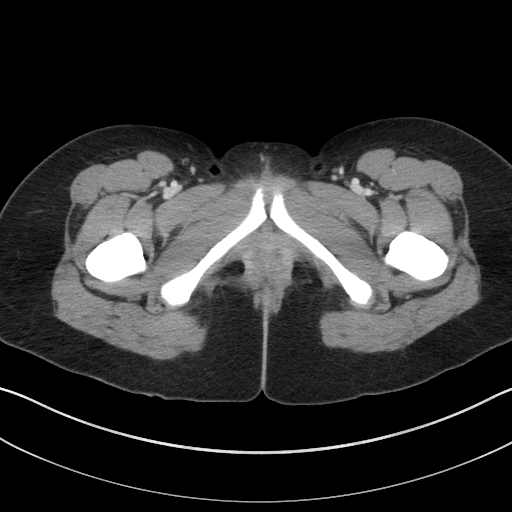
[im 4/86  bone]
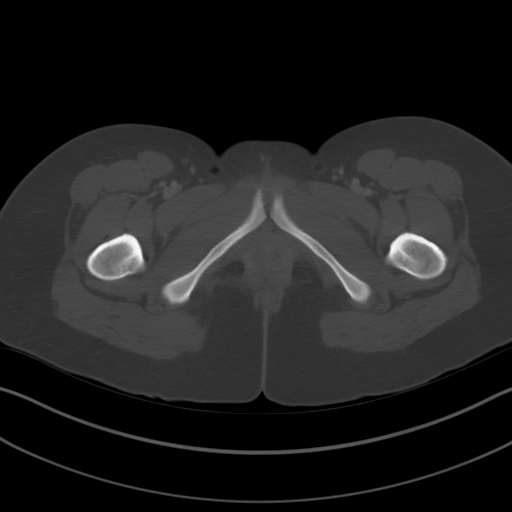
[im 11/86  soft-tissue]
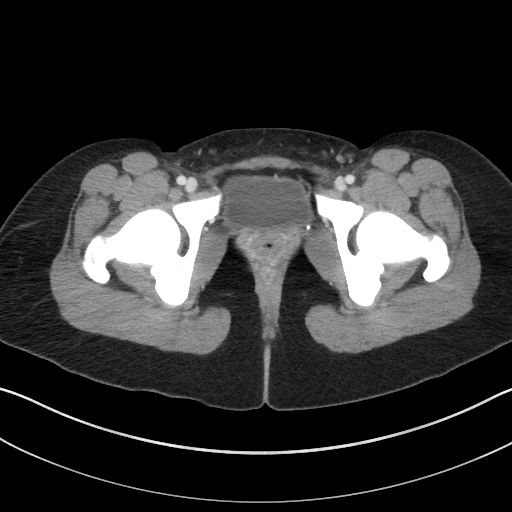
[im 18/86  soft-tissue]
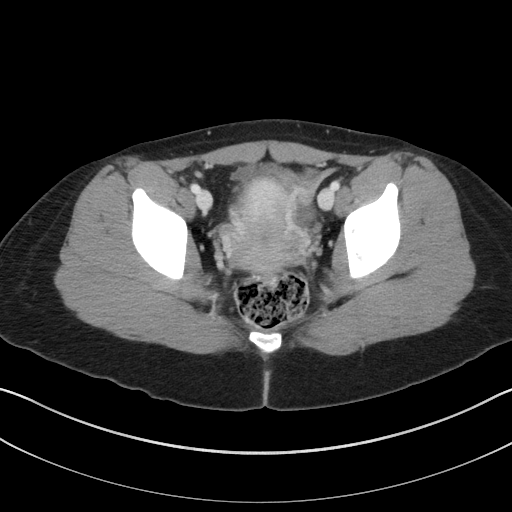
[im 25/86  soft-tissue]
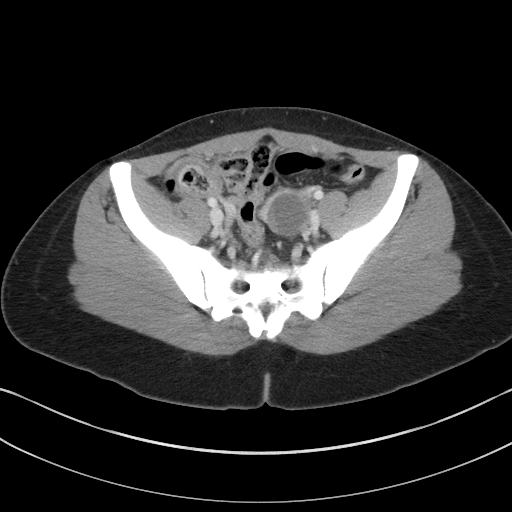
[im 32/86  soft-tissue]
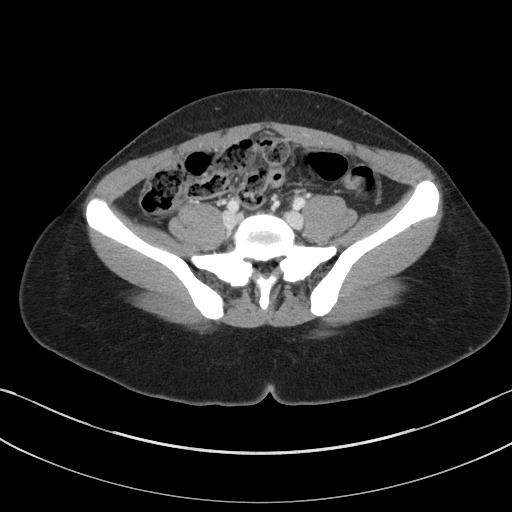
[im 39/86  soft-tissue]
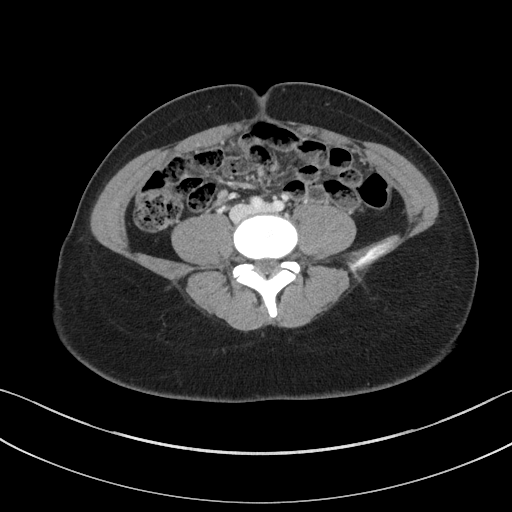
[im 47/86  soft-tissue]
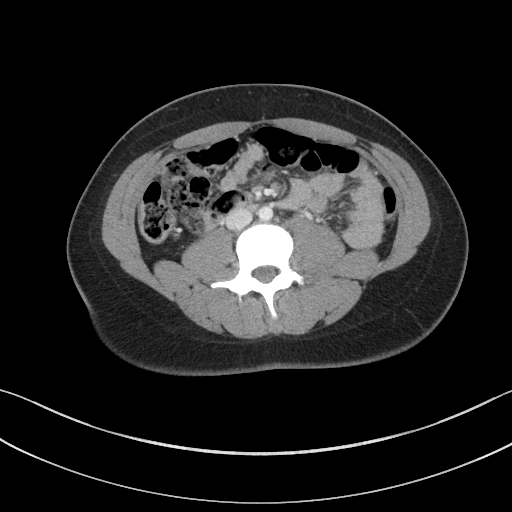
[im 54/86  soft-tissue]
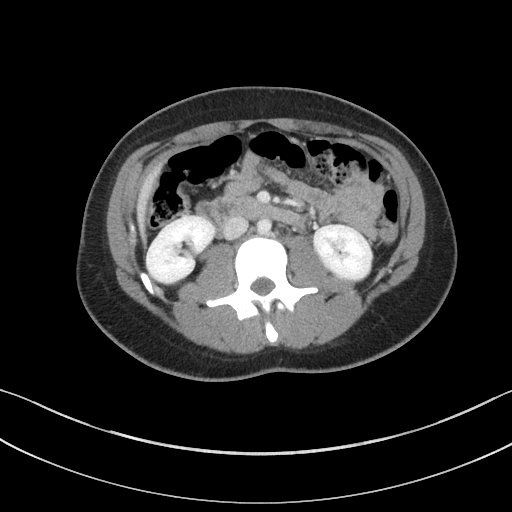
[im 61/86  soft-tissue]
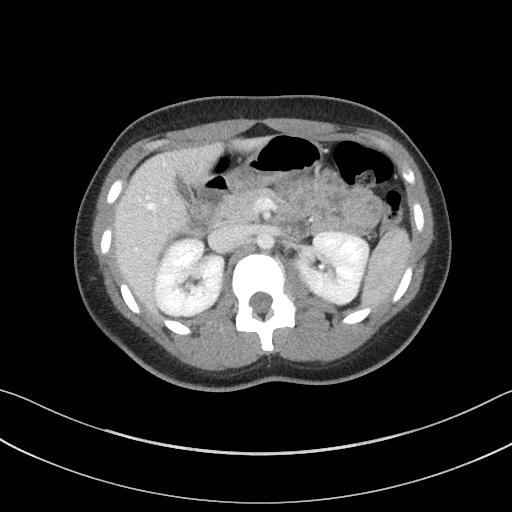
[im 61/86  bone]
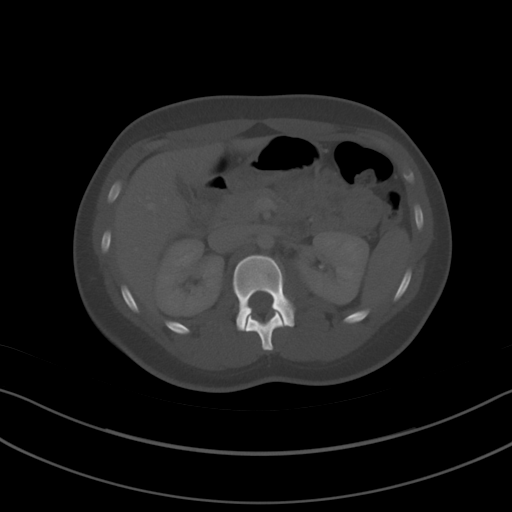
[im 68/86  soft-tissue]
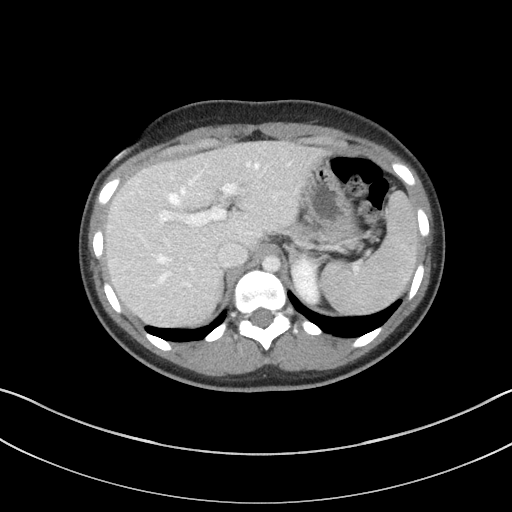
[im 75/86  soft-tissue]
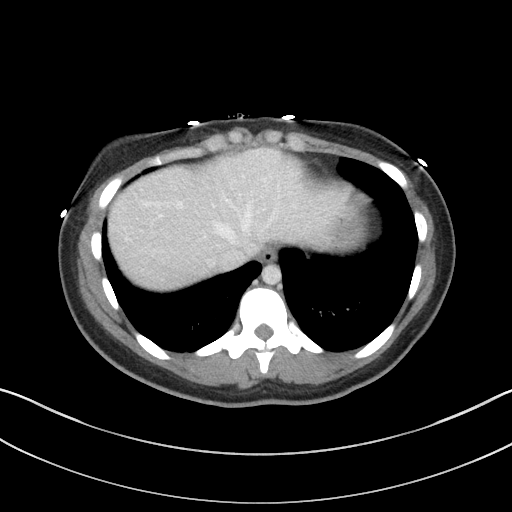
[im 82/86  soft-tissue]
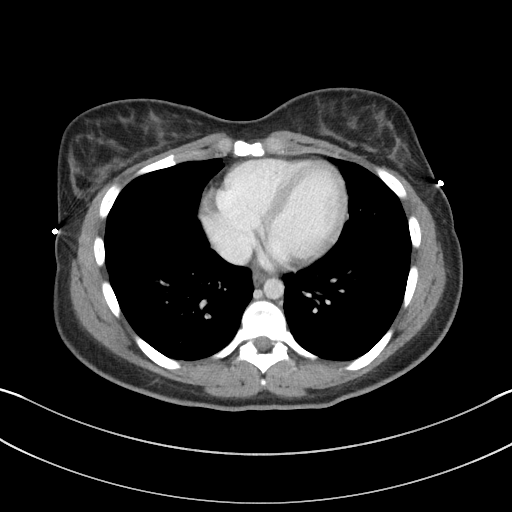

[Series 6: a/p w/ cor · coronal · 0.65mm/px · 3 of 109 slices shown]
[im 37/109  soft-tissue]
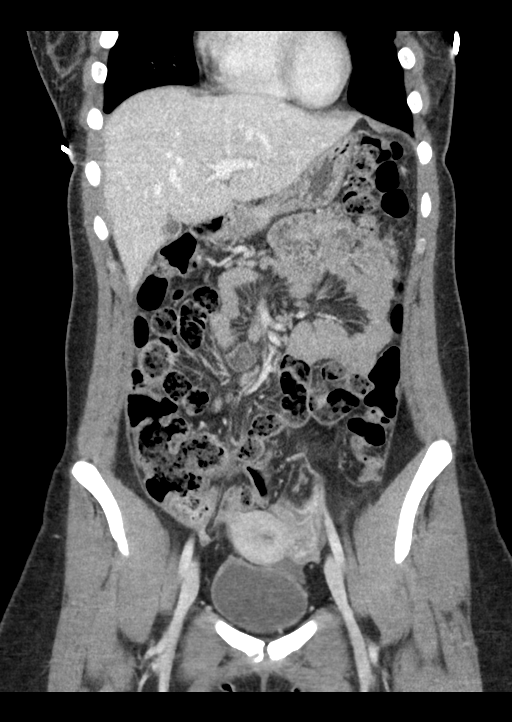
[im 49/109  soft-tissue]
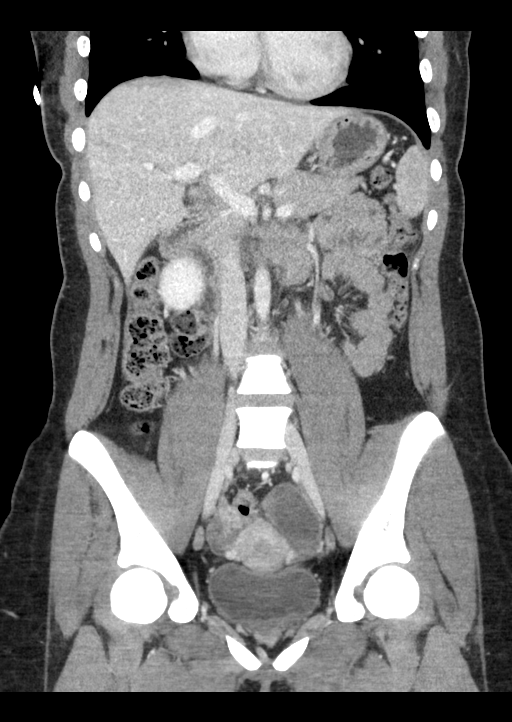
[im 61/109  soft-tissue]
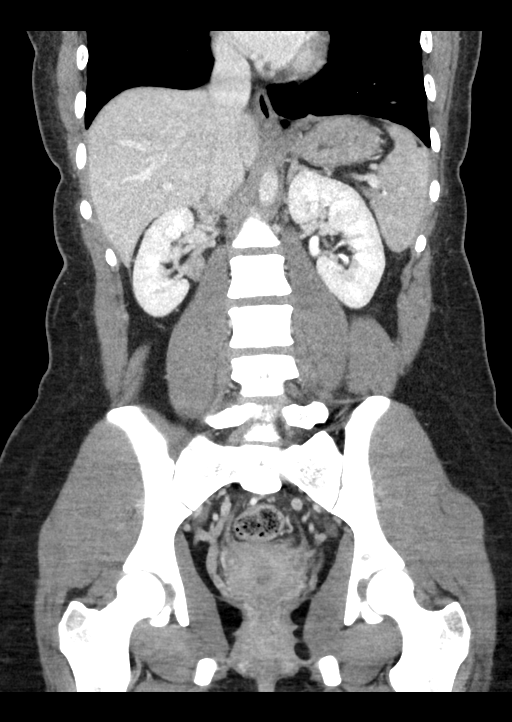

[15 of 46 positions shown; findings below may reference images not displayed]

FINDINGS: Lower chest: Lung bases are clear.

Hepatobiliary: No focal hepatic lesion. No biliary duct dilatation.
Gallbladder is normal. Common bile duct is normal.

Pancreas: Pancreas is normal. No ductal dilatation. No pancreatic
inflammation.

Spleen: Normal spleen

Adrenals/urinary tract: Adrenal glands and kidneys are normal. The
ureters and bladder normal.

Stomach/Bowel: Stomach, duodenum, small bowel are normal. There is
mild submucosal thickening in the cecum (Image 64, series 3). The
appendix is partially imaged and difficult ot follow. Appendix is
best seen on sagittal image 65, series 7 measuring 6 mm in diameter
which is within normal limits. Tip of the appendix not well
localized

The ascending, transverse and descending colon normal. The
rectosigmoid colon is normal.

Vascular/Lymphatic: Abdominal aorta is normal caliber. There is no
retroperitoneal or periportal lymphadenopathy. No pelvic
lymphadenopathy.

Reproductive: Uterus is normal. Cystic lesion associated with the
LEFT adnexa measures 3.2 x 3.1 by 4.5 cm. There is some mural
thickening of this cystic lesion and focal mural nodularity (image
63, series 3). A small cystic lesion associated the RIGHT ovary
measures 2.2 cm.

Small amount free fluid in the pelvis anterior to the uterus and
bladder (image 70, series 3 is also seen on sagittal image 82,
series 7.

Other: Smaller free fluid anterior to the uterus and superior to the
bladder.

Musculoskeletal: No aggressive osseous lesion.
IMPRESSION: 1. Cystic lesion associated with the LEFT ovary measures up to
cm has mild mural thickening. Differential would include a
hemorrhagic cyst, complex functional ovarian cyst, tubo-ovarian
abscess or other forms of pelvic inflammatory disease.
2. Small amount free fluid in the pelvis positioned anteriorly
between the uterus and bladder.
3. While the appendix is not well identified overall findings do not
favor appendicitis but this cannot be completely excluded as there
is some mild thickening of the cecum and appendix not identified in
its entirety.
4. Consider a pelvic ultrasound to evaluate the LEFT adnexa.

## 2018-05-12 IMAGING — US US TRANSVAGINAL NON-OB
1 series · 13 of 25 positions shown · non-contrast
Comparison: Pelvic ultrasound and CT of the abdomen and pelvis
performed 11/18/2015

CLINICAL DATA: Acute onset of vaginal bleeding the pelvic pain.
Initial encounter.

EXAM:
TRANSABDOMINAL AND TRANSVAGINAL ULTRASOUND OF PELVIS
DOPPLER ULTRASOUND OF OVARIES
TECHNIQUE: Both transabdominal and transvaginal ultrasound examinations of the
pelvis were performed. Transabdominal technique was performed for
global imaging of the pelvis including uterus, ovaries, adnexal
regions, and pelvic cul-de-sac.
It was necessary to proceed with endovaginal exam following the
transabdominal exam to visualize the endometrium in greater detail.
Color and duplex Doppler ultrasound was utilized to evaluate blood
flow to the ovaries.

[Series 1: us transvaginal non-ob · 0.18mm/px · 13 of 57 slices shown]
[im 1/57]
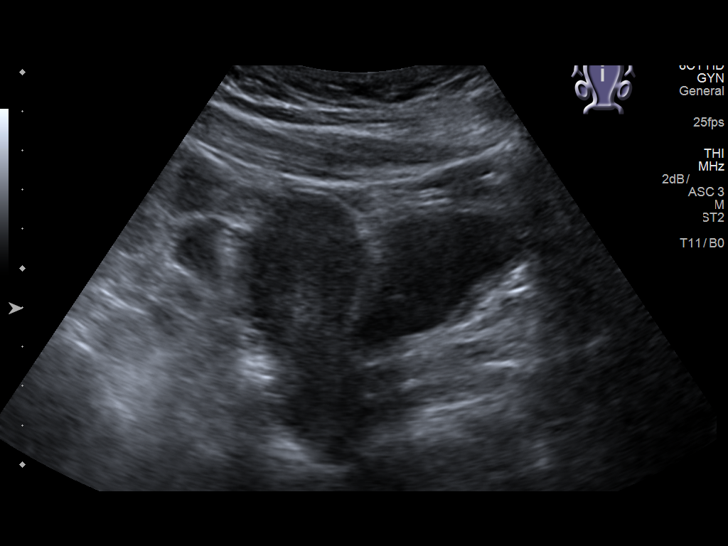
[im 5/57]
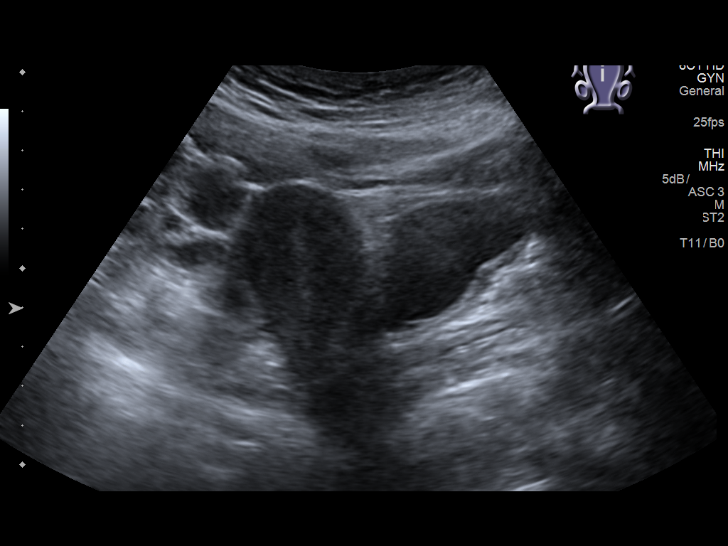
[im 10/57]
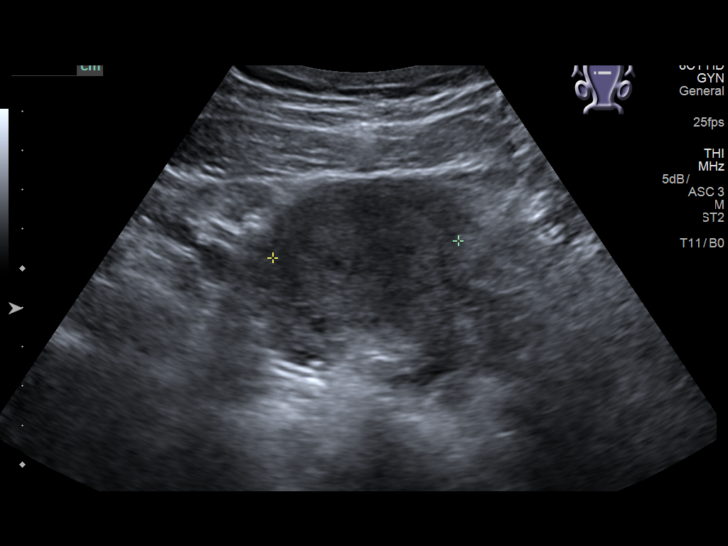
[im 15/57]
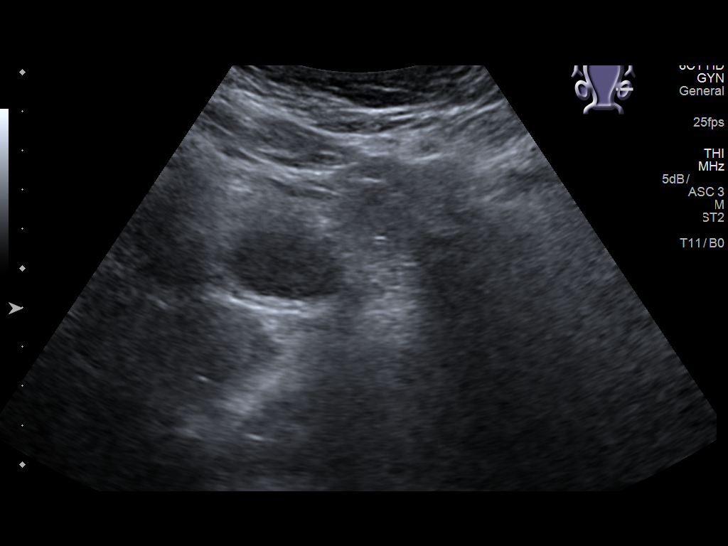
[im 19/57]
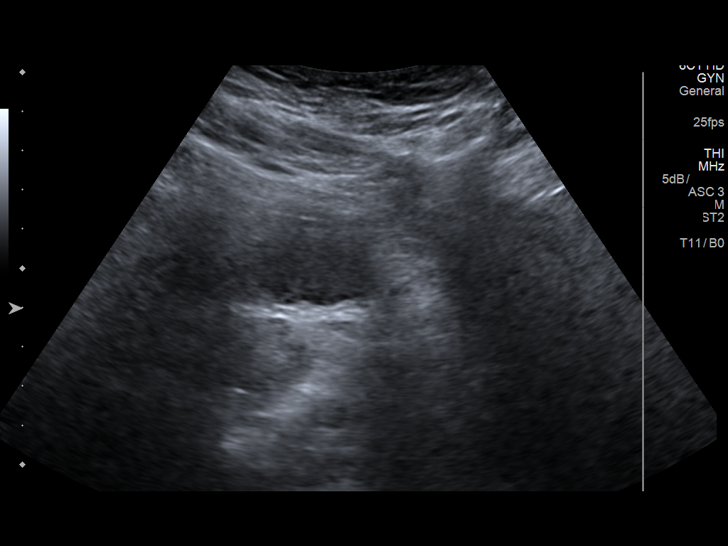
[im 24/57]
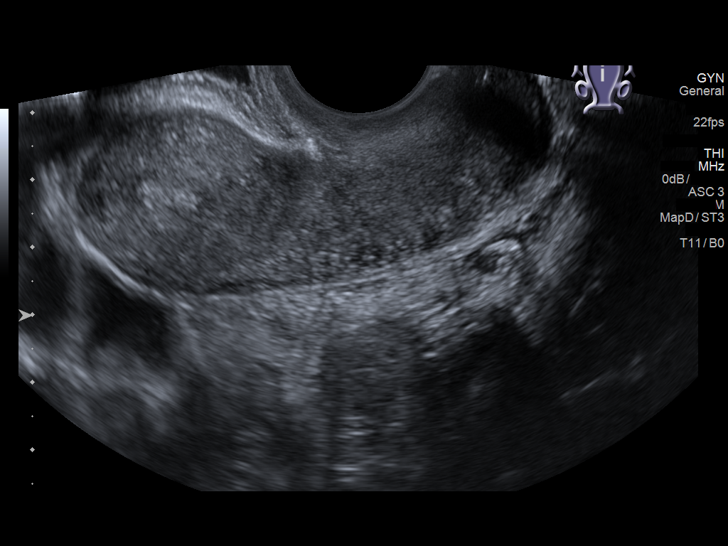
[im 29/57]
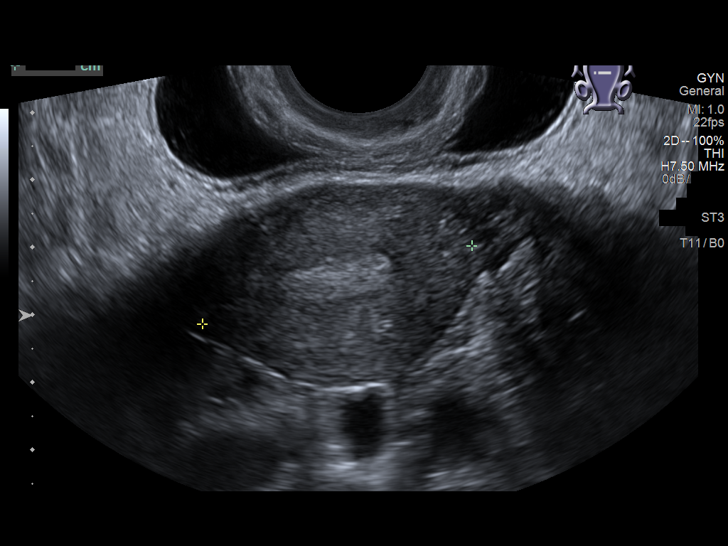
[im 33/57]
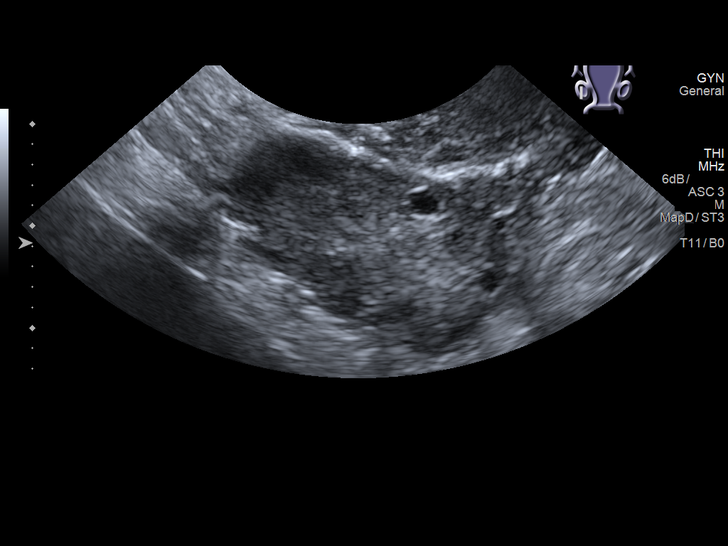
[im 38/57]
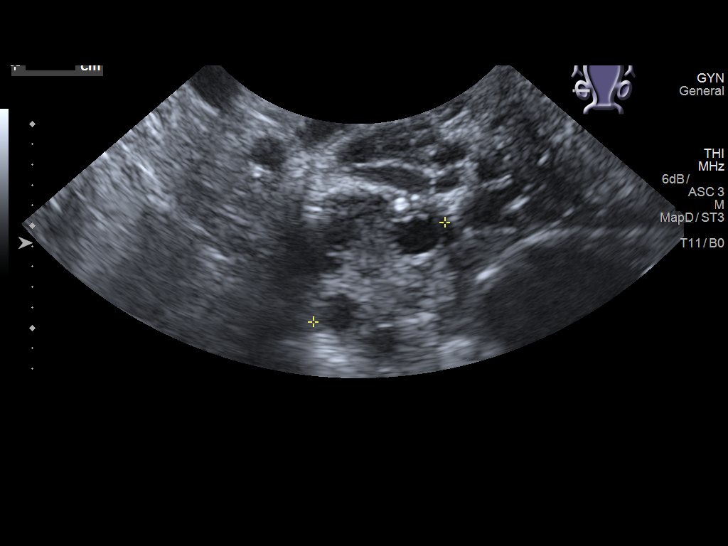
[im 43/57]
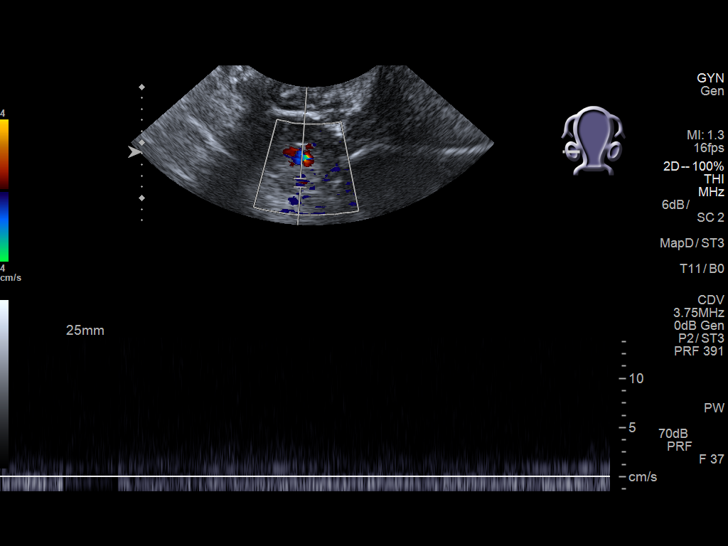
[im 47/57]
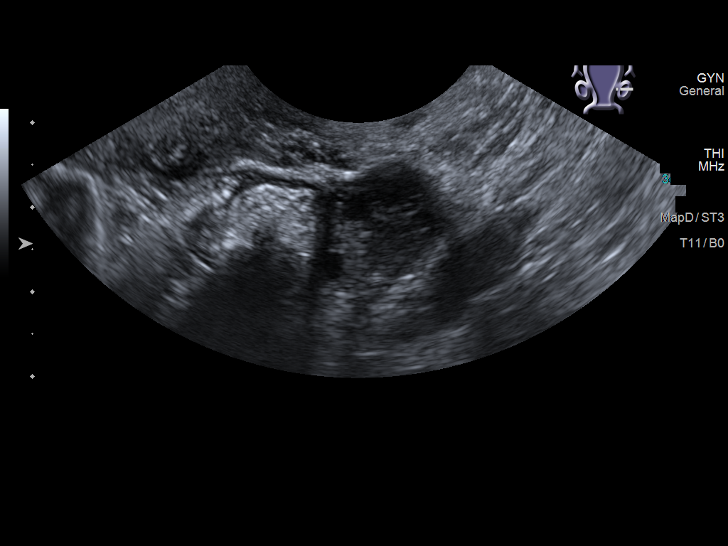
[im 52/57]
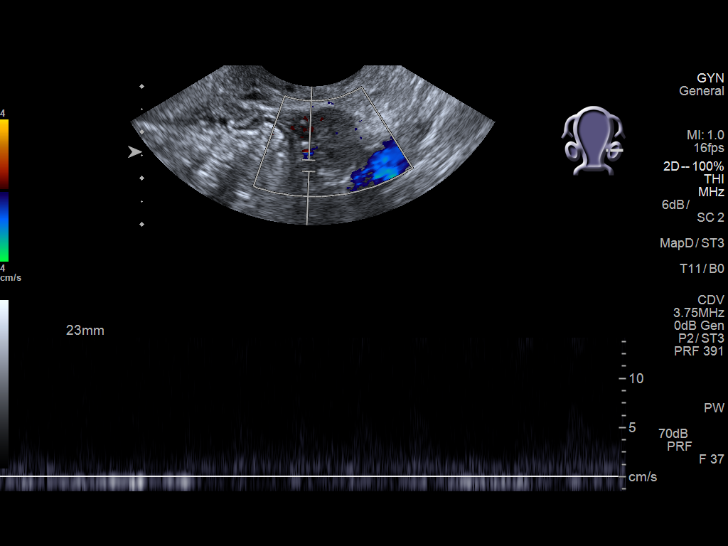
[im 57/57]
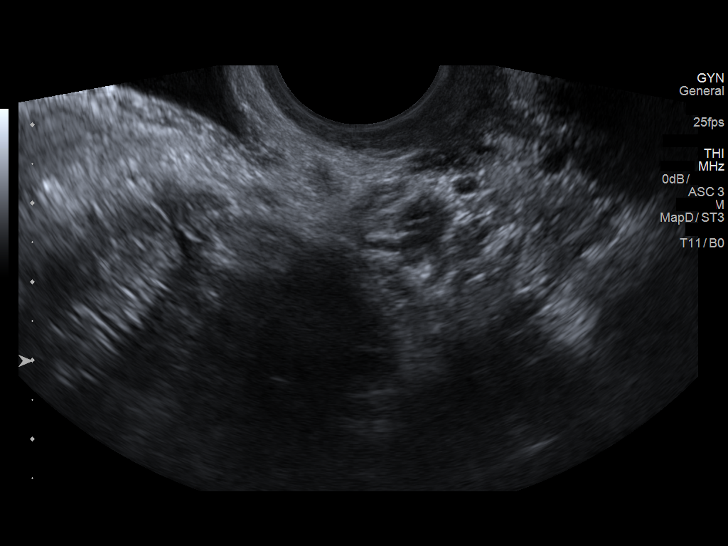

[13 of 25 positions shown; findings below may reference images not displayed]

FINDINGS: Uterus

Measurements: 7.1 x 2.9 x 4.8 cm. No fibroids or other mass
visualized.

Endometrium

Thickness: 0.5 cm.  No focal abnormality visualized.

Right ovary

Measurements: 2.6 x 1.7 x 1.6 cm. Normal appearance/no adnexal mass.

Left ovary

Measurements: 2.2 x 1.5 x 1.6 cm. Normal appearance/no adnexal mass.

Pulsed Doppler evaluation of both ovaries demonstrates normal
low-resistance arterial and venous waveforms.

Other findings

Trace free fluid is seen within the pelvic cul-de-sac.
IMPRESSION: Unremarkable pelvic ultrasound.  No evidence for ovarian torsion.

## 2019-11-13 ENCOUNTER — Ambulatory Visit (HOSPITAL_COMMUNITY)
Admission: EM | Admit: 2019-11-13 | Discharge: 2019-11-13 | Disposition: A | Discharge status: Home / Self Care | Payer: Self-pay | Attending: Family Medicine | Admitting: Family Medicine

## 2019-11-13 ENCOUNTER — Encounter (HOSPITAL_COMMUNITY): Payer: Self-pay

## 2019-11-13 ENCOUNTER — Other Ambulatory Visit: Payer: Self-pay

## 2019-11-13 DIAGNOSIS — Z113 Encounter for screening for infections with a predominantly sexual mode of transmission: Secondary | ICD-10-CM | POA: Insufficient documentation

## 2019-11-13 DIAGNOSIS — T192XXA Foreign body in vulva and vagina, initial encounter: Secondary | ICD-10-CM | POA: Insufficient documentation

## 2019-11-13 NOTE — ED Triage Notes (Signed)
Pt reports she put  A make up sponge inside of her vagina today, and she is not able to take it out. Pt reports she did it as her boyfriend is in town and she is having her period started today.

## 2019-11-13 NOTE — Discharge Instructions (Signed)
Foreign body removed  We are testing you for Gonorrhea, Chlamydia, Trichomonas, Yeast and Bacterial Vaginosis. We will call you if anything is positive and let you know if you require any further treatment. Please inform partners of any positive results.   Please return if symptoms not improving with treatment, development of fever, nausea, vomiting, abdominal pain.

## 2019-11-14 NOTE — ED Provider Notes (Signed)
[facility]    CSN: [id number] Arrival date & time: 11/13/19  1824      History   Chief Complaint Chief Complaint  Patient presents with  . Appointment    [phone]  . Foreign Body in Vagina    HPI Tammie Lee is a 24 y.o. female presenting today for evaluation of foreign body in vagina.  Patient states that she placed a make-up sponge in her vagina earlier today as she started her menstrual cycle.  She has been unable to take this out.  She denies any associated pain to but does feel some pressure she believes around her cervix.  Prior to placing she denies any abnormal discharge.  Denies any specific concerns for STDs, but does wish to proceed with screening.  HPI  Past Medical History:  Diagnosis Date  . Chlamydia   . PID (acute pelvic inflammatory disease)     There are no problems to display for this patient.   History reviewed. No pertinent surgical history.  OB History   No obstetric history on file.      Home Medications    Prior to Admission medications   Medication Sig Start Date End Date Taking? Authorizing Provider  doxycycline (VIBRAMYCIN) 100 MG capsule Take 1 capsule (100 mg total) by mouth 2 (two) times daily. Take as prescribed for 14 days. 10/01/16   Antony Madura, PA-C  naproxen (NAPROSYN) 500 MG tablet Take 1 tablet (500 mg total) by mouth 2 (two) times daily. 10/01/16   Antony Madura, PA-C  traMADol (ULTRAM) 50 MG tablet Take 1 tablet (50 mg total) by mouth every 6 (six) hours as needed for severe pain. 10/01/16   Antony Madura, PA-C    Family History History reviewed. No pertinent family history.  Social History Social History   Tobacco Use  . Smoking status: Never Smoker  . Smokeless tobacco: Never Used  Substance Use Topics  . Alcohol use: Yes    Comment: occasional  . Drug use: No     Allergies   Patient has no known allergies.   Review of Systems Review of Systems  Constitutional: Negative for fever.  Respiratory:  Negative for shortness of breath.   Cardiovascular: Negative for chest pain.  Gastrointestinal: Negative for abdominal pain, diarrhea, nausea and vomiting.  Genitourinary: Negative for dysuria, flank pain, genital sores, hematuria, menstrual problem, vaginal bleeding, vaginal discharge and vaginal pain.  Musculoskeletal: Negative for back pain.  Skin: Negative for rash.  Neurological: Negative for dizziness, light-headedness and headaches.     Physical Exam Triage Vital Signs ED Triage Vitals  Enc Vitals Group     BP 11/13/19 1845 103/67     Pulse Rate 11/13/19 1845 82     Resp 11/13/19 1845 18     Temp 11/13/19 1845 98.9 F (37.2 C)     Temp Source 11/13/19 1845 Oral     SpO2 11/13/19 1845 96 %     Weight --      Height --      Head Circumference --      Peak Flow --      Pain Score 11/13/19 1843 0     Pain Loc --      Pain Edu? --      Excl. in GC? --    No data found.  Updated Vital Signs BP 103/67 (BP Location: Right Arm)   Pulse 82   Temp 98.9 F (37.2 C) (Oral)   Resp 18   LMP 11/13/2019 (  Exact Date)   SpO2 96%   Visual Acuity Right Eye Distance:   Left Eye Distance:   Bilateral Distance:    Right Eye Near:   Left Eye Near:    Bilateral Near:     Physical Exam Vitals and nursing note reviewed.  Constitutional:      Appearance: She is well-developed.     Comments: No acute distress  HENT:     Head: Normocephalic and atraumatic.     Nose: Nose normal.  Eyes:     Conjunctiva/sclera: Conjunctivae normal.  Cardiovascular:     Rate and Rhythm: Normal rate.  Pulmonary:     Effort: Pulmonary effort is normal. No respiratory distress.  Abdominal:     General: There is no distension.  Genitourinary:    Comments: Normal external female genitalia, vaginal mucosa pink, triangular make-up sponge visualized and removed from vagina,, cervix pink, small amount of bright red blood seen in os Musculoskeletal:        General: Normal range of motion.      Cervical back: Neck supple.  Skin:    General: Skin is warm and dry.  Neurological:     Mental Status: She is alert and oriented to person, place, and time.      UC Treatments / Results  Labs (all labs ordered are listed, but only abnormal results are displayed) Labs Reviewed  CERVICOVAGINAL ANCILLARY ONLY    EKG   Radiology No results found.  Procedures Procedures (including critical care time)  Medications Ordered in UC Medications - No data to display  Initial Impression / Assessment and Plan / UC Course  I have reviewed the triage vital signs and the nursing notes.  Pertinent labs & imaging results that were available during my care of the patient were reviewed by me and considered in my medical decision making (see chart for details).     Foreign body Fully removed, did not appear to be in pieces.  Vaginal swab for STD screening pending.  Will call if abnormal and provide treatment as needed.  Discussed strict return precautions. Patient verbalized understanding and is agreeable with plan.  Final Clinical Impressions(s) / UC Diagnoses   Final diagnoses:  Foreign body in vagina, initial encounter  Screen for STD (sexually transmitted disease)     Discharge Instructions     Foreign body removed  We are testing you for Gonorrhea, Chlamydia, Trichomonas, Yeast and Bacterial Vaginosis. We will call you if anything is positive and let you know if you require any further treatment. Please inform partners of any positive results.   Please return if symptoms not improving with treatment, development of fever, nausea, vomiting, abdominal pain.    ED Prescriptions    None     PDMP not reviewed this encounter.   Janith Lima, [place] 11/14/19 [phone]

## 2019-11-17 ENCOUNTER — Telehealth (HOSPITAL_COMMUNITY): Payer: Self-pay | Admitting: Emergency Medicine

## 2019-11-17 LAB — CERVICOVAGINAL ANCILLARY ONLY
Bacterial vaginitis: POSITIVE — AB
Candida vaginitis: NEGATIVE
Chlamydia: NEGATIVE
Neisseria Gonorrhea: NEGATIVE
Trichomonas: POSITIVE — AB

## 2019-11-17 MED ORDER — METRONIDAZOLE 500 MG PO TABS: 500.0000 mg | ORAL_TABLET | Freq: Two times a day (BID) | ORAL | 0 refills | Status: AC

## 2019-11-17 NOTE — Telephone Encounter (Signed)
Trichomonas is positive. Rx  for Flagyl was sent to the pharmacy of record. Pt needs education to refrain from sexual intercourse for 7 days to give the medicine time to work. Sexual partners need to be notified and tested/treated. Condoms may reduce risk of reinfection. Recheck for further evaluation if symptoms are not improving.   Bacterial vaginosis is positive. Pt needs treatment. Flagyl 500 mg BID x 7 days #14 no refills sent to patients pharmacy of choice.    Attempted to reach patient. No answer at this time. Call cannot be completed.

## 2019-11-20 ENCOUNTER — Encounter (HOSPITAL_COMMUNITY): Payer: Self-pay

## 2019-11-20 ENCOUNTER — Telehealth (HOSPITAL_COMMUNITY): Payer: Self-pay | Admitting: Emergency Medicine

## 2019-11-20 NOTE — Telephone Encounter (Signed)
Attempted to reach patient x2. No answer at this time. Call cannot be completed. Pt activated mychart, mychart message sent.

## 2020-07-22 ENCOUNTER — Encounter (HOSPITAL_COMMUNITY): Payer: Self-pay | Admitting: Emergency Medicine

## 2020-07-22 ENCOUNTER — Emergency Department (HOSPITAL_COMMUNITY)
Admission: EM | Admit: 2020-07-22 | Discharge: 2020-07-22 | Disposition: A | Discharge status: Home / Self Care | Payer: Self-pay | Attending: Emergency Medicine | Admitting: Emergency Medicine

## 2020-07-22 ENCOUNTER — Other Ambulatory Visit: Payer: Self-pay

## 2020-07-22 ENCOUNTER — Emergency Department (HOSPITAL_COMMUNITY): Payer: Self-pay

## 2020-07-22 ENCOUNTER — Telehealth: Payer: Self-pay | Admitting: Obstetrics and Gynecology

## 2020-07-22 DIAGNOSIS — R1030 Lower abdominal pain, unspecified: Secondary | ICD-10-CM | POA: Insufficient documentation

## 2020-07-22 DIAGNOSIS — N939 Abnormal uterine and vaginal bleeding, unspecified: Secondary | ICD-10-CM

## 2020-07-22 DIAGNOSIS — O00101 Right tubal pregnancy without intrauterine pregnancy: Secondary | ICD-10-CM | POA: Insufficient documentation

## 2020-07-22 DIAGNOSIS — O26891 Other specified pregnancy related conditions, first trimester: Secondary | ICD-10-CM | POA: Insufficient documentation

## 2020-07-22 DIAGNOSIS — Z20822 Contact with and (suspected) exposure to covid-19: Secondary | ICD-10-CM | POA: Insufficient documentation

## 2020-07-22 DIAGNOSIS — R42 Dizziness and giddiness: Secondary | ICD-10-CM | POA: Insufficient documentation

## 2020-07-22 DIAGNOSIS — Z3A01 Less than 8 weeks gestation of pregnancy: Secondary | ICD-10-CM | POA: Insufficient documentation

## 2020-07-22 LAB — WET PREP, GENITAL
Clue Cells Wet Prep HPF POC: NONE SEEN
Sperm: NONE SEEN
Trich, Wet Prep: NONE SEEN
Yeast Wet Prep HPF POC: NONE SEEN

## 2020-07-22 LAB — I-STAT CHEM 8, ED
BUN: 10 mg/dL (ref 6–20)
Calcium, Ion: 1.26 mmol/L (ref 1.15–1.40)
Chloride: 104 mmol/L (ref 98–111)
Creatinine, Ser: 0.9 mg/dL (ref 0.44–1.00)
Glucose, Bld: 84 mg/dL (ref 70–99)
HCT: 44 % (ref 36.0–46.0)
Hemoglobin: 15 g/dL (ref 12.0–15.0)
Potassium: 4 mmol/L (ref 3.5–5.1)
Sodium: 140 mmol/L (ref 135–145)
TCO2: 24 mmol/L (ref 22–32)

## 2020-07-22 LAB — HCG, QUANTITATIVE, PREGNANCY: hCG, Beta Chain, Quant, S: 14489 m[IU]/mL — ABNORMAL HIGH (ref <5)

## 2020-07-22 LAB — RESPIRATORY PANEL BY RT PCR (FLU A&B, COVID)
Influenza A by PCR: NEGATIVE
Influenza B by PCR: NEGATIVE
SARS Coronavirus 2 by RT PCR: NEGATIVE

## 2020-07-22 MED ORDER — METHOTREXATE SODIUM CHEMO INJECTION 250 MG/10ML
50.0000 mg/m2 | Freq: Once | INTRAMUSCULAR | Status: AC
  Administered 2020-07-22: 100 mg via INTRAMUSCULAR
  Filled 2020-07-22: qty 4

## 2020-07-22 MED ORDER — METHOTREXATE SODIUM CHEMO INJECTION 250 MG/10ML
50.0000 mg/m2 | Freq: Once | INTRAMUSCULAR | Status: DC
  Filled 2020-07-22: qty 4

## 2020-07-22 MED ORDER — METHOTREXATE FOR ECTOPIC PREGNANCY: 50.0000 mg/m2 | Freq: Once | INTRAMUSCULAR | Status: DC

## 2020-07-22 MED ORDER — METHOTREXATE 25 MG/ML ~~LOC~~ SOSY: 50.0000 mg/m2 | PREFILLED_SYRINGE | Freq: Once | SUBCUTANEOUS | Status: DC

## 2020-07-22 NOTE — Telephone Encounter (Signed)
Spoke to patient about MTX administration and the risks involved with the injection. Advised that she should return to [facility] for any increased pain and/or bleeding; ok to take Tylenol. Advised to stay away from NSAIDs and gas producing foods. My Chart message sent to patient with information on ectopic pregnancy, MTX, MTX after care, and the contact information to [facility]. Patient verbalized an understanding of the plan of care and agrees.   Dr. Donavan Foil notified of patient instructions given and plan to f/u at [facility].  Raelyn Mora, CNM

## 2020-07-22 NOTE — Discharge Instructions (Signed)
You have been diagnosed with an ectopic pregnancy.  You have received Methotrexate.  Please follow up at [facility], address below, on Tuesday for recheck of your quantitative HCG.  I anticipate  you will notice more vaginal bleeding and abdominal cramping.  However if your pain is not bearable please follow up at [facility] at [facility] for further care.

## 2020-07-22 NOTE — ED Provider Notes (Signed)
[facility]-EMERGENCY DEPT Provider Note   CSN: [id number] Arrival date & time: 07/22/20  1502     History Chief Complaint  Patient presents with  . Vaginal Bleeding  . potential miscarriage    Tammie Lee is a 24 y.o. female.  The history is provided by the patient and medical records. No language interpreter was used.  Vaginal Bleeding    24 year old female approximately [redacted] weeks pregnant presenting with concerns of possible miscarriage.  Patient report her last menstrual period was September 13.  She had a positive pregnancy test at home.  She plan on aborting the fetus.  She reports developing abdominal cramping as well as vaginal bleeding passing clots and what appears to be product conception since yesterday and today.  She report low abdominal cramping rates as 8 out of 10.  She endorsed mild lightheadedness.  She denies fever, dysuria, vaginal discharge.  She has never been pregnant before.  Past Medical History:  Diagnosis Date  . Chlamydia   . PID (acute pelvic inflammatory disease)     There are no problems to display for this patient.   No past surgical history on file.   OB History   No obstetric history on file.     No family history on file.  Social History   Tobacco Use  . Smoking status: Never Smoker  . Smokeless tobacco: Never Used  Substance Use Topics  . Alcohol use: Yes    Comment: occasional  . Drug use: No    Home Medications Prior to Admission medications   Medication Sig Start Date End Date Taking? Authorizing Provider  doxycycline (VIBRAMYCIN) 100 MG capsule Take 1 capsule (100 mg total) by mouth 2 (two) times daily. Take as prescribed for 14 days. 10/01/16   Antony Madura, PA-C  naproxen (NAPROSYN) 500 MG tablet Take 1 tablet (500 mg total) by mouth 2 (two) times daily. 10/01/16   Antony Madura, PA-C  traMADol (ULTRAM) 50 MG tablet Take 1 tablet (50 mg total) by mouth every 6 (six) hours as needed for severe pain.  10/01/16   Antony Madura, PA-C    Allergies    Patient has no known allergies.  Review of Systems   Review of Systems  Genitourinary: Positive for vaginal bleeding.  All other systems reviewed and are negative.   Physical Exam Updated Vital Signs BP 137/72 (BP Location: Right Arm)   Pulse 89   Temp 97.9 F (36.6 C) (Oral)   Resp 17   Ht 5\' 1"  (1.549 m)   Wt 93.5 kg   SpO2 100%   BMI 38.96 kg/m   Physical Exam Vitals and nursing note reviewed. Exam conducted with a chaperone present.  Constitutional:      General: She is not in acute distress.    Appearance: She is well-developed.  HENT:     Head: Atraumatic.  Eyes:     Conjunctiva/sclera: Conjunctivae normal.  Cardiovascular:     Rate and Rhythm: Normal rate and regular rhythm.     Pulses: Normal pulses.     Heart sounds: Normal heart sounds.  Pulmonary:     Effort: Pulmonary effort is normal.     Breath sounds: Normal breath sounds.  Abdominal:     Palpations: Abdomen is soft.  Genitourinary:    Comments: Please refer to pelvic exam procedure section Musculoskeletal:     Cervical back: Neck supple.  Skin:    Findings: No rash.  Neurological:     Mental  Status: She is alert.     ED Results / Procedures / Treatments   Labs (all labs ordered are listed, but only abnormal results are displayed) Labs Reviewed  WET PREP, GENITAL - Abnormal; Notable for the following components:      Result Value   WBC, Wet Prep HPF POC FEW (*)    All other components within normal limits  HCG, QUANTITATIVE, PREGNANCY - Abnormal; Notable for the following components:   hCG, Beta Chain, Quant, S 14,489 (*)    All other components within normal limits  RESPIRATORY PANEL BY RT PCR (FLU A&B, COVID)  I-STAT CHEM 8, ED  ABO/RH  GC/CHLAMYDIA PROBE AMP ([place]) NOT AT [facility]    EKG None  Radiology US OB LESS THAN 14 WEEKS WITH OB TRANSVAGINAL  Result Date: 07/22/2020 CLINICAL DATA:  Heavy vaginal bleeding EXAM:  OBSTETRIC <14 WK [place] AND TRANSVAGINAL OB US TECHNIQUE: Both transabdominal and transvaginal ultrasound examinations were performed for complete evaluation of the gestation as well as the maternal uterus, adnexal regions, and pelvic cul-de-sac. Transvaginal technique was performed to assess early pregnancy. COMPARISON:  None. FINDINGS: Intrauterine gestational sac: None Yolk sac:  Not Visualized. Embryo:  Not Visualized. Scratch Maternal uterus/adnexae: Ovaries are within normal limits. The left ovary measures 2.1 x 1.4 x 1.7 cm and contains corpus luteum. Right ovary measures 3.2 x 1.5 x 2.2 cm. Within the cornu of the right uterus, there is a mass with hypoechoic center and peripheral echogenic rim. No significant free fluid. The mass measures 2 x 2 cm (my measurement) IMPRESSION: 1. No IUP identified. 2. 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 No free fluid at this time. Critical Value/emergent results were called by telephone at the time of interpretation on 07/22/2020 at 6:33 pm to provider Dr. Renaye Rakers, who verbally acknowledged these results. Electronically Signed   By: Jasmine Pang M.D.   On: 07/22/2020 18:34    Procedures Pelvic exam  Date/Time: 07/22/2020 4:20 PM Performed by: Fayrene Helper, PA-C Authorized by: Fayrene Helper, PA-C  Local anesthesia used: no  Anesthesia: Local anesthesia used: no  Sedation: Patient sedated: no  Comments: Katie, RN available to chaperone.  No inguinal lymphadenopathy or inguinal hernia noted.  Normal external genitalia with small amount of blood at the introitus.  Discomfort with speculum insertion.  Close cervical os with small mucous plug and small amount of blood noted.  No obvious products of conception.  On bimanual examination bilateral adnexal tenderness without cervical motion tenderness.  Examination is limited due to patient  discomfort.  .Critical Care Performed by: Fayrene Helper, PA-C Authorized by: Fayrene Helper, PA-C   Critical care provider statement:    Critical care time (minutes):  33   Critical care was time spent personally by me on the following activities:  Discussions with consultants, evaluation of patient's response to treatment, examination of patient, ordering and performing treatments and interventions, ordering and review of laboratory studies, ordering and review of radiographic studies, pulse oximetry, re-evaluation of patient's condition, obtaining history from patient or surrogate and review of old charts   (including critical care time)  Medications Ordered in ED Medications  methotrexate chemo injection 100 mg (has no administration in time range)  methotrexate chemo injection 100 mg (100 mg Intramuscular Given 07/22/20 2108)    ED Course  I have reviewed the triage vital signs and the nursing notes.  Pertinent labs &  imaging results that were available during my care of the patient were reviewed by me and considered in my medical decision making (see chart for details).    MDM Rules/Calculators/A&P                          BP (!) 145/75   Pulse 86   Temp 98.2 F (36.8 C)   Resp 19   SpO2 98%   Final Clinical Impression(s) / ED Diagnoses Final diagnoses:  Right tubal pregnancy without intrauterine pregnancy    Rx / DC Orders ED Discharge Orders    None     4:21 PM Patient believes she has a miscarriage when she developed low abdominal cramping with vaginal bleeding and what appears to be products of conception since yesterday.  She mentioned having a transvaginal ultrasound a week ago without any obvious visualizations of the fetus.  Given her pain and vaginal bleeding, will perform Pap examination as well as repeat transvaginal ultrasound to rule out ectopic pregnancy.  6:44 PM Quantitative beta hCG is 14,000.  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  No free fluid at this time.  Will consult OBGYN for recommendation.   7:16 PM Appreciate consultation from on-call obstetrician Dr. Donavan Foil, who acknowledged finding and recommend conservative approach including methotrexate injection here and patient will need to follow-up with any UA on day 4 for quant recheck as well as day 7 for quantitative recheck.  Will be instructed to return if her symptoms worsen.  10:01 PM Pt was given methotrexate and tolerates well.  She will be discharge to f/u with [facility] as per recommendation.    Fayrene Helper, PA-C 07/22/20 2206    Terald Sleeper, MD 07/22/20 [phone]

## 2020-07-22 NOTE — ED Triage Notes (Signed)
Patient [redacted] weeks pregnant, states she had abdominal cramping yesterday and started bleeding heavily this morning. She thinks she is having a miscarriage.

## 2020-07-23 LAB — ABO/RH: ABO/RH(D): O POS

## 2020-07-25 ENCOUNTER — Other Ambulatory Visit: Payer: Self-pay

## 2020-07-25 DIAGNOSIS — O009 Unspecified ectopic pregnancy without intrauterine pregnancy: Secondary | ICD-10-CM

## 2020-07-25 LAB — GC/CHLAMYDIA PROBE AMP (~~LOC~~) NOT AT ARMC
Chlamydia: NEGATIVE
Comment: NEGATIVE
Comment: NORMAL
Neisseria Gonorrhea: NEGATIVE

## 2020-07-25 NOTE — Progress Notes (Addendum)
  Chief Complaint:  STAT HCG QUANT   Tammie Lee is 24 y.o. G1P0. Patient is here for follow up of quantitative HCG and ongoing surveillance of pregnancy status. She is [redacted]w[redacted]d gestation by LMP 06/06/20.  Since her last visit, the patient is without new complaint.   The patient reports bleeding and cramping as none now.   Labs: Her previous Quantitative HCG values are: 07/22/20 - HCG 14,489 07/25/20 - HCG 9709  Assessment: [redacted]w[redacted]d here for ongoing surveillance of pregnancy after [facility] visit for VB and cramping on 07/22/20.  Plan: Will contact patient with a plan once STAT HCG results are reviewed with the provider today   Update: Levels dropped appropriately. Needs Day 7 HCG quant per Dr. Donavan Foil

## 2020-07-26 ENCOUNTER — Telehealth: Payer: Self-pay | Admitting: Lactation Services

## 2020-07-26 LAB — BETA HCG QUANT (REF LAB): hCG Quant: 9709 m[IU]/mL

## 2020-07-26 NOTE — Telephone Encounter (Signed)
-----   Message from Adam Phenix, MD sent at 07/26/2020 10:42 AM EDT ----- Repeat HCG day 7

## 2020-07-26 NOTE — Telephone Encounter (Signed)
Called patient to inform her of HCG and need for follow up HCG on Friday 11/05. Patient did not answer. LM for patient to call the office for results and recommendation for follow up. My Chart message sent.

## 2020-07-27 NOTE — Telephone Encounter (Signed)
Called patient and she is aware she has an appointment on Monday for follow up lab work. Patient currently on [place] and plans to come back for follow up.

## 2020-08-01 ENCOUNTER — Other Ambulatory Visit: Payer: Medicaid Other

## 2020-08-01 ENCOUNTER — Other Ambulatory Visit: Payer: Self-pay

## 2020-08-01 DIAGNOSIS — O039 Complete or unspecified spontaneous abortion without complication: Secondary | ICD-10-CM

## 2020-08-02 LAB — BETA HCG QUANT (REF LAB): hCG Quant: 7793 m[IU]/mL

## 2020-08-08 ENCOUNTER — Other Ambulatory Visit: Payer: Medicaid Other

## 2020-09-18 ENCOUNTER — Emergency Department (HOSPITAL_COMMUNITY): Payer: Self-pay

## 2020-09-18 ENCOUNTER — Other Ambulatory Visit: Payer: Self-pay

## 2020-09-18 ENCOUNTER — Emergency Department (HOSPITAL_COMMUNITY)
Admission: EM | Admit: 2020-09-18 | Discharge: 2020-09-18 | Disposition: A | Discharge status: Home / Self Care | Payer: Self-pay | Attending: Emergency Medicine | Admitting: Emergency Medicine

## 2020-09-18 ENCOUNTER — Encounter (HOSPITAL_COMMUNITY): Payer: Self-pay

## 2020-09-18 ENCOUNTER — Emergency Department (HOSPITAL_COMMUNITY)
Admission: EM | Admit: 2020-09-18 | Discharge: 2020-09-18 | Disposition: A | Discharge status: Home / Self Care | Payer: Medicaid Other | Attending: Emergency Medicine | Admitting: Emergency Medicine

## 2020-09-18 ENCOUNTER — Encounter (HOSPITAL_COMMUNITY): Payer: Self-pay | Admitting: Emergency Medicine

## 2020-09-18 DIAGNOSIS — E349 Endocrine disorder, unspecified: Secondary | ICD-10-CM

## 2020-09-18 DIAGNOSIS — R102 Pelvic and perineal pain: Secondary | ICD-10-CM | POA: Insufficient documentation

## 2020-09-18 DIAGNOSIS — R1031 Right lower quadrant pain: Secondary | ICD-10-CM | POA: Insufficient documentation

## 2020-09-18 DIAGNOSIS — Z5321 Procedure and treatment not carried out due to patient leaving prior to being seen by health care provider: Secondary | ICD-10-CM | POA: Insufficient documentation

## 2020-09-18 DIAGNOSIS — R891 Abnormal level of hormones in specimens from other organs, systems and tissues: Secondary | ICD-10-CM | POA: Insufficient documentation

## 2020-09-18 LAB — CBC WITH DIFFERENTIAL/PLATELET
Abs Immature Granulocytes: 0.03 10*3/uL (ref 0.00–0.07)
Basophils Absolute: 0.1 10*3/uL (ref 0.0–0.1)
Basophils Relative: 1 %
Eosinophils Absolute: 0.2 10*3/uL (ref 0.0–0.5)
Eosinophils Relative: 3 %
HCT: 37.8 % (ref 36.0–46.0)
Hemoglobin: 12.4 g/dL (ref 12.0–15.0)
Immature Granulocytes: 0 %
Lymphocytes Relative: 34 %
Lymphs Abs: 2.9 10*3/uL (ref 0.7–4.0)
MCH: 28.9 pg (ref 26.0–34.0)
MCHC: 32.8 g/dL (ref 30.0–36.0)
MCV: 88.1 fL (ref 80.0–100.0)
Monocytes Absolute: 0.7 10*3/uL (ref 0.1–1.0)
Monocytes Relative: 8 %
Neutro Abs: 4.5 10*3/uL (ref 1.7–7.7)
Neutrophils Relative %: 54 %
Platelets: 240 10*3/uL (ref 150–400)
RBC: 4.29 MIL/uL (ref 3.87–5.11)
RDW: 13.2 % (ref 11.5–15.5)
WBC: 8.3 10*3/uL (ref 4.0–10.5)
nRBC: 0 % (ref 0.0–0.2)

## 2020-09-18 LAB — COMPREHENSIVE METABOLIC PANEL
ALT: 16 U/L (ref 0–44)
AST: 23 U/L (ref 15–41)
Albumin: 3.9 g/dL (ref 3.5–5.0)
Alkaline Phosphatase: 74 U/L (ref 38–126)
Anion gap: 8 (ref 5–15)
BUN: 13 mg/dL (ref 6–20)
CO2: 24 mmol/L (ref 22–32)
Calcium: 8.9 mg/dL (ref 8.9–10.3)
Chloride: 105 mmol/L (ref 98–111)
Creatinine, Ser: 0.88 mg/dL (ref 0.44–1.00)
GFR, Estimated: >60 mL/min (ref >60)
Glucose, Bld: 77 mg/dL (ref 70–99)
Potassium: 3.9 mmol/L (ref 3.5–5.1)
Sodium: 137 mmol/L (ref 135–145)
Total Bilirubin: 0.6 mg/dL (ref 0.3–1.2)
Total Protein: 7.8 g/dL (ref 6.5–8.1)

## 2020-09-18 LAB — POC URINE PREG, ED: Preg Test, Ur: NEGATIVE

## 2020-09-18 LAB — HCG, QUANTITATIVE, PREGNANCY: hCG, Beta Chain, Quant, S: 55 m[IU]/mL — ABNORMAL HIGH (ref <5)

## 2020-09-18 MED ORDER — OXYCODONE-ACETAMINOPHEN 5-325 MG PO TABS
1.0000 | ORAL_TABLET | Freq: Four times a day (QID) | ORAL | 0 refills | Status: DC | PRN
Start: 2020-09-18 — End: 2024-08-16

## 2020-09-18 MED ORDER — OXYCODONE-ACETAMINOPHEN 5-325 MG PO TABS
1.0000 | ORAL_TABLET | Freq: Once | ORAL | Status: AC
  Administered 2020-09-18: 16:00:00 1 via ORAL
  Filled 2020-09-18: qty 1

## 2020-09-18 NOTE — ED Notes (Signed)
IV removed from right upper arm

## 2020-09-18 NOTE — ED Triage Notes (Addendum)
Patient states she has hospitalized on 12/24 for a ruptured cyst and PID. Patient reports pelvic pain and heavy bleeding for several months. Patient has been unable to obtain pain medication due to pharmacy being [place]. Patient was told she needed to be seen in ED in [place] to have pain medication prescribed. Left [facility] earlier d/t long wait times.

## 2020-09-18 NOTE — ED Notes (Signed)
Pt called 3x for vitals no response  

## 2020-09-18 NOTE — ED Notes (Signed)
Patient transported to US 

## 2020-09-18 NOTE — ED Provider Notes (Signed)
[facility]-EMERGENCY DEPT Provider Note   CSN: [id number] Arrival date & time: 09/18/20  1105     History No chief complaint on file.   Tammie Lee is a 24 y.o. female   The history is provided by the patient and medical records.       Past Medical History:  Diagnosis Date   Chlamydia    PID (acute pelvic inflammatory disease)     There are no problems to display for this patient.   History reviewed. No pertinent surgical history.   OB History    Gravida  1   Para      Term      Preterm      AB  1   Living        SAB      IAB      Ectopic  1   Multiple      Live Births              History reviewed. No pertinent family history.  Social History   Tobacco Use   Smoking status: Never Smoker   Smokeless tobacco: Never Used  Substance Use Topics   Alcohol use: Yes    Comment: occasional   Drug use: No    Home Medications Prior to Admission medications   Medication Sig Start Date End Date Taking? Authorizing Provider  doxycycline (VIBRAMYCIN) 100 MG capsule Take 1 capsule (100 mg total) by mouth 2 (two) times daily. Take as prescribed for 14 days. 10/01/16   Antony Madura, PA-C  naproxen (NAPROSYN) 500 MG tablet Take 1 tablet (500 mg total) by mouth 2 (two) times daily. 10/01/16   Antony Madura, PA-C  oxyCODONE-acetaminophen (PERCOCET/ROXICET) 5-325 MG tablet Take 1 tablet by mouth every 6 (six) hours as needed for severe pain. 09/18/20   Rhylen Pulido, [place], PA-C  traMADol (ULTRAM) 50 MG tablet Take 1 tablet (50 mg total) by mouth every 6 (six) hours as needed for severe pain. 10/01/16   Antony Madura, PA-C    Allergies    Patient has no known allergies.  Review of Systems   Review of Systems  Constitutional: Negative for fever.  Gastrointestinal: Positive for abdominal pain.  Genitourinary: Positive for pelvic pain and vaginal bleeding.  All other systems reviewed and are negative.   Physical Exam Updated  Vital Signs BP 130/78    Pulse 83    Temp 98.7 F (37.1 C) (Oral)    Resp 20    SpO2 100%   Physical Exam Vitals and nursing note reviewed.  Constitutional:      General: She is not in acute distress.    Appearance: She is well-developed and well-nourished. She is obese.  HENT:     Head: Normocephalic and atraumatic.  Eyes:     Conjunctiva/sclera: Conjunctivae normal.  Cardiovascular:     Rate and Rhythm: Normal rate and regular rhythm.  Pulmonary:     Effort: Pulmonary effort is normal. No respiratory distress.     Breath sounds: Normal breath sounds.  Abdominal:     General: Bowel sounds are normal.     Palpations: Abdomen is soft.     Tenderness: There is abdominal tenderness. There is guarding. There is no rebound.     Comments: Generalized TTP though worse in right lower quadrant  Skin:    General: Skin is warm.  Neurological:     Mental Status: She is alert.  Psychiatric:        Mood  and Affect: Mood and affect normal.        Behavior: Behavior normal.     ED Results / Procedures / Treatments   Labs (all labs ordered are listed, but only abnormal results are displayed) Labs Reviewed  HCG, QUANTITATIVE, PREGNANCY - Abnormal; Notable for the following components:      Result Value   hCG, Beta Chain, Quant, S 55 (*)    All other components within normal limits  COMPREHENSIVE METABOLIC PANEL  CBC WITH DIFFERENTIAL/PLATELET  POC URINE PREG, ED    EKG None  Radiology US OB LESS THAN 14 WEEKS W/ OB TRANSVAGINAL AND DOPPLER  Addendum Date: 09/18/2020   ADDENDUM REPORT: 09/18/2020 19:50 ADDENDUM: In the setting of a prior treated ectopic pregnancy on the right, the visualized right-sided mass could also represent retained products of conception. An MRI can be considered for further evaluation. Electronically Signed   By: Katherine Mantle M.D.   On: 09/18/2020 19:50   Result Date: 09/18/2020 CLINICAL DATA:  Bilateral pelvic pain.  Vaginal bleeding. EXAM:  OBSTETRIC <14 WK [place] AND TRANSVAGINAL OB US DOPPLER ULTRASOUND OF OVARIES TECHNIQUE: Both transabdominal and transvaginal ultrasound examinations were performed for complete evaluation of the gestation as well as the maternal uterus, adnexal regions, and pelvic cul-de-sac. Transvaginal technique was performed to assess early pregnancy. Color and duplex Doppler ultrasound was utilized to evaluate blood flow to the ovaries. COMPARISON:  07/22/2020 FINDINGS: Intrauterine gestational sac: None Yolk sac:  Not Visualized. Embryo:  Not Visualized. Cardiac Activity: Not Visualized. Maternal uterus/adnexae: In the region of the right ovary there is a 4.1 x 4 x 3.8 cm heterogeneous mass without significant internal color Doppler flow. There is fluid within the cervix and lower uterine segment. There is a small volume of complex free fluid in the patient's pelvis. The left ovary measures 2.7 x 3.3 x 2.5 cm (11 mL in volume). The right ovary measures 6 by 4.2 by 5.6 cm (74 mL in volume). Pulsed Doppler evaluation of both ovaries demonstrates normal appearing low-resistance arterial and venous waveforms. IMPRESSION: 1. No IUP is visualized. By definition, in the setting of a positive pregnancy test, this reflects a pregnancy of unknown location. Differential considerations include early normal IUP, abnormal IUP/missed abortion, or nonvisualized ectopic pregnancy. Serial beta HCG is suggested. Consider repeat pelvic ultrasound in 14 days. 2. Normal venous and Doppler waveforms involving both ovaries. 3. There is a 4.1 cm mass involving the right ovary. Given the lack of significant internal color Doppler flow, this is favored to represent a hemorrhagic cyst versus less likely an endometrioma. A 6-12 week follow-up ultrasound is recommended to confirm resolution. 4. Small volume fluid in the lower uterine segment and cervix may represent blood products. 5. Small volume slightly complex free fluid in the patient's pelvis, favored to  be physiologic. Electronically Signed: By: Katherine Mantle M.D. On: 09/18/2020 19:34    Procedures Procedures (including critical care time)  Medications Ordered in ED Medications  oxyCODONE-acetaminophen (PERCOCET/ROXICET) 5-325 MG per tablet 1 tablet (1 tablet Oral Given 09/18/20 1618)    ED Course  I have reviewed the triage vital signs and the nursing notes.  Pertinent labs & imaging results that were available during my care of the patient were reviewed by me and considered in my medical decision making (see chart for details).  Clinical Course as of 09/18/20 2350  Sun Sep 18, 2020  1626 Dr. Donavan Foil with OB consulted. Agrees with repeat U/S, need to rule out ruptured ectopic.  Quant can still be downtrending at this point 2 months since ectopic. If quant is downtrending and [place] with findings favoring cyst, then it is likely cyst causing pain. Will have her follow closely outpatient with [facility] to continue to trend [JR]  2000 Discussed ultrasound with radiologist considering patient's history of ectopic.  Also discussed with OB Dr. Donavan Foil.  Considering downtrending hCG now at 55, and location of initial ectopic pregnancy, presentation today and adnexal mass favors ovarian cyst.  Does not believe MRI is necessary at this time.  Patient will be discharged with strict return precautions and instructions to follow closely outpatient to continue to trend hCG levels.  Patient verbalized understanding of this and agrees with care plan for discharge. [JR]    Clinical Course User Index [JR] Torrez Renfroe, [place] N, PA-C   MDM Rules/Calculators/A&P                          Patient here for persisting pelvic pain.  This was been intermittent since recent bleed diagnosed ectopic on July 22, 2020, treated with methotrexate.  Patient was lost to follow-up outpatient for continued hCG trending.  She states her last appointment was around November 11 at [facility].  She has been evaluated at outside facility in  [place] 2 days ago as discussed above with pelvic ultrasound with findings consistent with ovarian cyst, hCG of 131.  Pelvic exam was done.  She is treated prophylactically for PID though STD cultures has since resulted as negative.  Presentation is concerning for persisting ectopic pregnancy with continued elevated hCG 2 days ago after 2 months since diagnosis.  She has quite a bit of tenderness on exam, pain treated.  She is not vomiting.  Hemoglobin is stable today.  No leukocytosis.  hCG has down trended to 55.  Repeat pelvic ultrasound with findings favoring hemorrhagic ovarian cyst per OB on-call Dr. Donavan Foil.  Recommends findings of the right ovary are much less likely ectopic and more likely cyst versus hemorrhagic cyst.  Does not believe patient needs emergent MRI at this time.  Recommend she is appropriate for outpatient follow-up to continue to trend hCG levels.  Patient is agreeable with this plan.  She continues to ask for narcotic pain medication prescription, and requests a weeks worth of medication.  Informed patient that we will be unable to provide her with such a large prescription for narcotic.  She is instructed to treat her symptoms with over-the-counter medications such as Tylenol and ibuprofen.  She can take the oxycodone only as needed for more significant pain.  She can discuss with her gynecologist any additional pain medication prescriptions. Discussed importance of follow-up with gynecology with patient.  She is also instructed of strict return precautions.  She verbalized understanding agrees with care plan.  She is discharged in no distress.  Final Clinical Impression(s) / ED Diagnoses Final diagnoses:  Pelvic pain  Elevated serum hCG    Rx / DC Orders ED Discharge Orders         Ordered    oxyCODONE-acetaminophen (PERCOCET/ROXICET) 5-325 MG tablet  Every 6 hours PRN        09/18/20 2010           Jaeli Grubb, [place] N, [place] 09/18/20 2357    Alvira Monday,  MD 09/19/20 [phone]

## 2020-09-18 NOTE — Discharge Instructions (Signed)
It is important that you follow-up this week with [facility] to continue to trend your hCG hormone levels. If you experience worsening pain or frequent vomiting, you will need reevaluation at them maternity assessment unit at [facility].  This is at entrance C.

## 2020-09-18 NOTE — ED Notes (Signed)
Charge RN asked to attempt [place] IV as patient is difficult stick.
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
## 2021-03-13 ENCOUNTER — Encounter (HOSPITAL_COMMUNITY): Payer: Self-pay | Admitting: Emergency Medicine

## 2021-03-13 ENCOUNTER — Emergency Department (HOSPITAL_COMMUNITY)
Admission: EM | Admit: 2021-03-13 | Discharge: 2021-03-13 | Discharge status: Against Medical Advice | Payer: Medicaid Other | Attending: Emergency Medicine | Admitting: Emergency Medicine

## 2021-03-13 ENCOUNTER — Other Ambulatory Visit: Payer: Self-pay

## 2021-03-13 DIAGNOSIS — T7421XA Adult sexual abuse, confirmed, initial encounter: Secondary | ICD-10-CM | POA: Insufficient documentation

## 2021-03-13 DIAGNOSIS — R45851 Suicidal ideations: Secondary | ICD-10-CM | POA: Insufficient documentation

## 2021-03-13 DIAGNOSIS — Z5321 Procedure and treatment not carried out due to patient leaving prior to being seen by health care provider: Secondary | ICD-10-CM | POA: Insufficient documentation

## 2021-03-13 NOTE — ED Notes (Signed)
Pt refusing to be dressed in burgundy scrubs. RN explained that due to pt endorsing SI/HI, changing into the scrubs is protocol.

## 2021-03-13 NOTE — ED Notes (Addendum)
This RN walked by patients room and she was no longer in the room. This RN checked bathrooms, lobby, and parkinglot. Patient eloped and did not alert anyone she was leaving.   Freida Busman, EDP made aware by Theron Arista, PA
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
## 2021-03-13 NOTE — ED Notes (Signed)
Called Bascom Levels, SANE who is with another patient at this time and will be a little while before she gets to this patient.
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
## 2024-08-16 ENCOUNTER — Encounter (HOSPITAL_COMMUNITY): Payer: Self-pay | Admitting: Emergency Medicine

## 2024-08-16 ENCOUNTER — Ambulatory Visit (HOSPITAL_COMMUNITY)
Admission: EM | Admit: 2024-08-16 | Discharge: 2024-08-16 | Disposition: A | Discharge status: Home / Self Care | Payer: Self-pay | Attending: Nurse Practitioner | Admitting: Nurse Practitioner

## 2024-08-16 DIAGNOSIS — R319 Hematuria, unspecified: Secondary | ICD-10-CM | POA: Insufficient documentation

## 2024-08-16 DIAGNOSIS — R5383 Other fatigue: Secondary | ICD-10-CM | POA: Insufficient documentation

## 2024-08-16 DIAGNOSIS — Z113 Encounter for screening for infections with a predominantly sexual mode of transmission: Secondary | ICD-10-CM | POA: Insufficient documentation

## 2024-08-16 HISTORY — DX: Unspecified ovarian cyst, unspecified side: N83.209

## 2024-08-16 LAB — POCT URINE DIPSTICK
Bilirubin, UA: NEGATIVE
Glucose, UA: NEGATIVE mg/dL
Nitrite, UA: NEGATIVE
Spec Grav, UA: 1.02 (ref 1.010–1.025)
Urobilinogen, UA: 0.2 U/dL
pH, UA: 7 (ref 5.0–8.0)

## 2024-08-16 LAB — POCT URINE PREGNANCY: Preg Test, Ur: NEGATIVE

## 2024-08-16 NOTE — ED Provider Notes (Signed)
 [facility]    CSN: [id number] Arrival date & time: 08/16/24  9068      History   Chief Complaint Chief Complaint  Patient presents with   Hematuria    HPI Tammie Lee is a 28 y.o. female.   Discussed the use of AI scribe software for clinical note transcription with the patient, who gave verbal consent to proceed.   Patient presents with hematuria and dysuria following recent unprotected sexual intercourse. She reports noticing bright red blood on toilet tissue after urination this morning, along with red-tinged urine in the toilet. She states that a burning sensation with urination began five days ago, starting the day after unprotected sex, and she has continued to experience persistent burning and vaginal irritation throughout the week. She initially believed the discomfort was due to vaginal irritation from intercourse, as she does not typically engage in unprotected sex. She denies vaginal discharge, odor, or true urinary frequency but continues to report vaginal burning and irritation.  She recently learned that her sexual partner has engaged in high-risk sexual behaviors, increasing her concern for sexually transmitted infections. This partner has been her only sexual contact in the past three months.  Additionally, the patient describes several years of fatigue, low energy, and gradual weight gain. She is concerned about possible thyroid dysfunction or PCOS and notes a family history of thyroid disease in her mother. Her last menstrual period was November 7th. She denies nausea, vomiting, fever, or abdominal pain.  Today's evaluation has revealed no signs of a dangerous process. Discussed diagnosis with patient and/or guardian. Patient and/or guardian aware of their diagnosis, possible red flag symptoms to watch out for and need for close follow up. Patient and/or guardian understands verbal and written discharge instructions. Patient and/or guardian comfortable  with plan and disposition.  Patient and/or guardian has a clear mental status at this time, good insight into illness (after discussion and teaching) and has clear judgment to make decisions regarding their care  Documentation was completed with the aid of voice recognition software. Transcription may contain typographical errors.     Past Medical History:  Diagnosis Date   Chlamydia    Ovarian cyst    PID (acute pelvic inflammatory disease)     There are no active problems to display for this patient.   History reviewed. No pertinent surgical history.  OB History     Gravida  1   Para      Term      Preterm      AB  1   Living         SAB      IAB      Ectopic  1   Multiple      Live Births               Home Medications    Prior to Admission medications   Not on File    Family History No family history on file.  Social History Social History   Tobacco Use   Smoking status: Never   Smokeless tobacco: Never  Substance Use Topics   Alcohol use: Yes    Comment: occasional   Drug use: No     Allergies   Patient has no known allergies.   Review of Systems Review of Systems  Constitutional:  Positive for fatigue (for the past 3 years). Negative for activity change, appetite change and fever.  Gastrointestinal:  Positive for abdominal pain (lower abdominal discomfort). Negative for  nausea and vomiting.  Genitourinary:  Positive for hematuria. Negative for dysuria, menstrual problem (LMP 07/31/24) and vaginal discharge.       Vaginal irritation. No vaginal discharge, itching or odor   All other systems reviewed and are negative.    Physical Exam Triage Vital Signs ED Triage Vitals  Encounter Vitals Group     BP 08/16/24 1000 129/84     Girls Systolic BP Percentile --      Girls Diastolic BP Percentile --      Boys Systolic BP Percentile --      Boys Diastolic BP Percentile --      Pulse Rate 08/16/24 1000 71     Resp 08/16/24  1000 17     Temp 08/16/24 1000 98.9 F (37.2 C)     Temp Source 08/16/24 1000 Oral     SpO2 08/16/24 1000 96 %     Weight --      Height --      Head Circumference --      Peak Flow --      Pain Score 08/16/24 0958 2     Pain Loc --      Pain Education --      Exclude from Growth Chart --    No data found.  Updated Vital Signs BP 129/84 (BP Location: Right Arm)   Pulse 71   Temp 98.9 F (37.2 C) (Oral)   Resp 17   LMP 07/31/2024 (Exact Date)   SpO2 96%   Visual Acuity Right Eye Distance:   Left Eye Distance:   Bilateral Distance:    Right Eye Near:   Left Eye Near:    Bilateral Near:     Physical Exam Constitutional:      General: She is not in acute distress.    Appearance: Normal appearance. She is well-developed. She is obese. She is not ill-appearing, toxic-appearing or diaphoretic.  HENT:     Head: Normocephalic.     Nose: Nose normal.     Mouth/Throat:     Mouth: Mucous membranes are moist.  Eyes:     Conjunctiva/sclera: Conjunctivae normal.  Neck:     Thyroid: No thyroid mass, thyromegaly or thyroid tenderness.  Cardiovascular:     Rate and Rhythm: Normal rate.  Pulmonary:     Effort: Pulmonary effort is normal.  Abdominal:     Palpations: Abdomen is soft.  Genitourinary:    Comments: Deferred; patient performed self-swab for Aptima testing  Musculoskeletal:        General: Normal range of motion.     Cervical back: Normal range of motion and neck supple.  Lymphadenopathy:     Cervical: No cervical adenopathy.  Skin:    General: Skin is warm and dry.  Neurological:     General: No focal deficit present.     Mental Status: She is alert and oriented to person, place, and time.  Psychiatric:        Mood and Affect: Mood normal.        Behavior: Behavior normal. Behavior is cooperative.      UC Treatments / Results  Labs (all labs ordered are listed, but only abnormal results are displayed) Labs Reviewed  POCT URINE DIPSTICK - Abnormal;  Notable for the following components:      Result Value   Clarity, UA turbid (*)    Ketones, POC UA trace (5) (*)    Blood, UA trace-intact (*)    Leukocytes, UA Trace (*)  All other components within normal limits  URINE CULTURE  HIV ANTIBODY (ROUTINE TESTING W REFLEX)  SYPHILIS: RPR W/REFLEX TO RPR TITER AND TREPONEMAL ANTIBODIES, TRADITIONAL SCREENING AND DIAGNOSIS ALGORITHM  COMPREHENSIVE METABOLIC PANEL WITH GFR  CBC WITH DIFFERENTIAL/PLATELET  TSH  T4, FREE  VITAMIN B12  FOLATE  IRON AND TIBC  FERRITIN  RETICULOCYTES  POCT URINE PREGNANCY  CERVICOVAGINAL ANCILLARY ONLY    EKG   Radiology No results found.  Procedures Procedures (including critical care time)  Medications Ordered in UC Medications - No data to display  Initial Impression / Assessment and Plan / UC Course  I have reviewed the triage vital signs and the nursing notes.  Pertinent labs & imaging results that were available during my care of the patient were reviewed by me and considered in my medical decision making (see chart for details).    Patient presents with gross hematuria noted this morning and a five-day history of dysuria that began after unprotected intercourse. Urinalysis today showed turbid urine with trace red blood cells and trace leukocytes, which does not clearly indicate a significant urinary tract infection but warrants further evaluation given the timing of symptoms and associated vaginal irritation. A urine culture was sent to assess for bacterial infection, and a vaginal swab was obtained to test for gonorrhea, chlamydia, trichomonas, bacterial vaginosis, and yeast. Blood work for HIV and syphilis was also performed due to potential STI exposure. She was advised to increase fluid intake, monitor symptoms, and avoid sexual activity until all results return. She will be contacted if anything is abnormal, and otherwise may review results through MyChart.  The patient also reports  chronic fatigue, low energy, and weight gain over the past three years, raising concern for possible thyroid dysfunction or PCOS, especially with a maternal history of Graves disease. Additional laboratory testing was ordered, including TSH, free T4, CBC, CMP, and an anemia panel. She was instructed to establish care with Dr. Jarold for primary care follow-up, review of her lab results, and coordination of any needed referrals. She should return to the emergency department if she develops worsening abdominal pain, fever, vomiting, flank pain, or an inability to urinate.  Today's evaluation has revealed no signs of a dangerous process. Discussed diagnosis with patient and/or guardian. Patient and/or guardian aware of their diagnosis, possible red flag symptoms to watch out for and need for close follow up. Patient and/or guardian understands verbal and written discharge instructions. Patient and/or guardian comfortable with plan and disposition.  Patient and/or guardian has a clear mental status at this time, good insight into illness (after discussion and teaching) and has clear judgment to make decisions regarding their care  Documentation was completed with the aid of voice recognition software. Transcription may contain typographical errors.   Final Clinical Impressions(s) / UC Diagnoses   Final diagnoses:  Hematuria, unspecified type  Screen for STD (sexually transmitted disease)  Fatigue, unspecified type     Discharge Instructions      You were seen today vaginal irritation and blood in your urine, which began shortly after having unprotected sex. Your urine test today showed a small amount of blood and white blood cells, but not enough to confirm a strong urinary tract infection. Because your symptoms started right after sexual activity and you are also having vaginal irritation, we are checking for both a possible mild UTI and sexually transmitted infections. Your urine was sent for a  culture to look for bacteria, and a vaginal swab was collected to  test for gonorrhea, chlamydia, trichomonas, bacterial vaginosis, and yeast. Blood tests were also drawn to check for HIV and syphilis. While waiting for results, drink plenty of water to help flush your urinary system and ease irritation. Avoid sexual activity until all test results are back and your symptoms have completely resolved. We will call you if any results are positive or need treatment. You can also check your results through your MyChart account.  You also mentioned fatigue, low energy, and weight gain that have been going on for several years. Because these symptoms can be related to thyroid problems, anemia, or other medical issues, I ordered additional blood work today. You should set up an appointment with Dr. Jarold to establish primary care, review these results, and discuss whether you may need further testing or a referral to a specialist. Please call the office tomorrow to schedule that visit.  Go to the emergency room right away if you develop severe abdominal pain, fever, vomiting, back or flank pain, inability to urinate, worsening blood in your urine, or if anything feels suddenly worse or concerning.      ED Prescriptions   None    PDMP not reviewed this encounter.   Iola Lukes, [place] 08/16/24 1109

## 2024-08-16 NOTE — Discharge Instructions (Addendum)
 You were seen today vaginal irritation and blood in your urine, which began shortly after having unprotected sex. Your urine test today showed a small amount of blood and white blood cells, but not enough to confirm a strong urinary tract infection. Because your symptoms started right after sexual activity and you are also having vaginal irritation, we are checking for both a possible mild UTI and sexually transmitted infections. Your urine was sent for a culture to look for bacteria, and a vaginal swab was collected to test for gonorrhea, chlamydia, trichomonas, bacterial vaginosis, and yeast. Blood tests were also drawn to check for HIV and syphilis. While waiting for results, drink plenty of water to help flush your urinary system and ease irritation. Avoid sexual activity until all test results are back and your symptoms have completely resolved. We will call you if any results are positive or need treatment. You can also check your results through your MyChart account.  You also mentioned fatigue, low energy, and weight gain that have been going on for several years. Because these symptoms can be related to thyroid problems, anemia, or other medical issues, I ordered additional blood work today. You should set up an appointment with Dr. Jarold to establish primary care, review these results, and discuss whether you may need further testing or a referral to a specialist. Please call the office tomorrow to schedule that visit.  Go to the emergency room right away if you develop severe abdominal pain, fever, vomiting, back or flank pain, inability to urinate, worsening blood in your urine, or if anything feels suddenly worse or concerning.

## 2024-08-16 NOTE — ED Triage Notes (Signed)
 Pt reports having bright red blood in urine today. Reports pressure. Reports recently had her menstrual cycle.

## 2024-08-16 NOTE — ED Notes (Signed)
 This RN went to obtain blood for orders that are placed. Patient reports that she is a hard stick and they normally get her in her wrist. This RN applied tourniquet to patients arm to look for an alternate vein as this RN does not feel comfortable sticking veins in wrist. The patient states that she is not able to tolerate the tourniquet being tight on her arm and asked to be taken off. This RN removed tourniquet and made Sam, NP aware that not able to obtain blood specimens. NP went going to room to attempt blood draw.

## 2024-08-17 ENCOUNTER — Ambulatory Visit (HOSPITAL_COMMUNITY): Payer: Self-pay

## 2024-08-17 LAB — URINE CULTURE

## 2024-08-17 LAB — CERVICOVAGINAL ANCILLARY ONLY
Bacterial Vaginitis (gardnerella): POSITIVE — AB
Candida Glabrata: NEGATIVE
Candida Vaginitis: NEGATIVE
Chlamydia: NEGATIVE
Comment: NEGATIVE
Comment: NEGATIVE
Comment: NEGATIVE
Comment: NEGATIVE
Comment: NEGATIVE
Comment: NORMAL
Neisseria Gonorrhea: NEGATIVE
Trichomonas: NEGATIVE

## 2024-08-17 MED ORDER — METRONIDAZOLE 500 MG PO TABS: 500.0000 mg | ORAL_TABLET | Freq: Two times a day (BID) | ORAL | 0 refills | Status: AC

## 2024-08-21 ENCOUNTER — Ambulatory Visit (HOSPITAL_COMMUNITY): Payer: Self-pay
